# Patient Record
Sex: Female | Born: 1995 | Race: Black or African American | Hispanic: No | Marital: Single | Smoking: Never smoker
Health system: Southern US, Community
[De-identification: ages and names within clinical notes are randomized; demographics above are authoritative.]

## PROBLEM LIST (undated history)

## (undated) DIAGNOSIS — Z789 Other specified health status: Secondary | ICD-10-CM

## (undated) HISTORY — DX: Other specified health status: Z78.9

## (undated) HISTORY — PX: TONSILLECTOMY: SUR1361

---

## 2017-06-14 LAB — OB RESULTS CONSOLE VARICELLA ZOSTER ANTIBODY, IGG: VARICELLA IGG: IMMUNE

## 2017-06-14 LAB — OB RESULTS CONSOLE ANTIBODY SCREEN: Antibody Screen: NEGATIVE

## 2017-06-14 LAB — OB RESULTS CONSOLE HGB/HCT, BLOOD
HEMATOCRIT: 32
Hemoglobin: 10.7

## 2017-06-14 LAB — OB RESULTS CONSOLE ABO/RH: RH Type: NEGATIVE

## 2017-06-14 LAB — OB RESULTS CONSOLE HIV ANTIBODY (ROUTINE TESTING): HIV: NONREACTIVE

## 2017-06-14 LAB — OB RESULTS CONSOLE RUBELLA ANTIBODY, IGM: RUBELLA: IMMUNE

## 2017-06-14 LAB — OB RESULTS CONSOLE RPR: RPR: NONREACTIVE

## 2017-06-14 LAB — OB RESULTS CONSOLE HEPATITIS B SURFACE ANTIGEN: Hepatitis B Surface Ag: NEGATIVE

## 2017-06-14 LAB — OB RESULTS CONSOLE PLATELET COUNT: PLATELETS: 200

## 2017-06-16 LAB — CULTURE, OB URINE: URINE CULTURE, OB: NO GROWTH

## 2017-07-26 LAB — OB RESULTS CONSOLE GC/CHLAMYDIA
CHLAMYDIA, DNA PROBE: NEGATIVE
GC PROBE AMP, GENITAL: NEGATIVE

## 2017-07-31 NOTE — L&D Delivery Note (Signed)
Operative Delivery Note 22 y.o. G2P1001 at [redacted]w[redacted]d with maternal exhaustion after pushing for over 2 hours. Cervical examination showed fetal station of +2; ROP presentation.  Discussed options of continuing to push, operative vaginal delivery with vacuum or cesarean section. Risks and benefits discussed in detail. Patient desires vacuum assistance. FHR ovrrall reassuring; does have occasional variable decelerations  Risks of vacuum assistance were discussed in detail, including but not limited to, bleeding, infection, damage to maternal tissues, fetal cephalohematoma, inability to effect vaginal delivery of the head or shoulder dystocia that cannot be resolved by established maneuvers and need for emergency cesarean section.  Patient gave verbal consent.  Patient was examined and found to be fully dilated with fetal station of +2. The soft vacuum cup was positioned over the sagittal suture 3 cm anterior to posterior fontanelle.  Pressure was then increased to 500 mmHg, and the patient was instructed to push.  Pulling was administered along the pelvic curve.  One pull was administered during each push, no popoffs.  After about fix pushes, the infant was then delivered atraumatically,  At 3:38 PM a viable female was delivered via Vaginal, Vacuum [employer], operational).  Presentation: vertex; Position: Right,, Occiput,, Posterior.  Apgars of 7 and 9, weight was 7 pounds 15 ounces.  Arterial cord pH 7.14.  There was spontaneous placental delivery, intact with three-vessel cord.   Anesthesia:  Epidural Instruments: routine Episiotomy: none Lacerations: 2nd degree Suture Repair: 3.0 Monocryl and 0 Vicryl Est. Blood Loss (mL):  350 ml  Sponge, instrument and needle counts were correct x2.  The patient and baby were stable after delivery and remained in couplet care on the postpartum unit.   Jaynie Collins, MD, FACOG Obstetrician & Gynecologist, [facility] for [employer], [facility]

## 2017-09-20 LAB — OB RESULTS CONSOLE HGB/HCT, BLOOD
HEMATOCRIT: 34
Hemoglobin: 11.2

## 2017-09-20 LAB — OB RESULTS CONSOLE HIV ANTIBODY (ROUTINE TESTING): HIV: NONREACTIVE

## 2017-09-20 LAB — OB RESULTS CONSOLE RPR: RPR: NONREACTIVE

## 2017-09-20 LAB — OB RESULTS CONSOLE PLATELET COUNT: Platelets: 204

## 2017-10-16 ENCOUNTER — Encounter: Payer: Self-pay | Admitting: *Deleted

## 2017-10-22 ENCOUNTER — Encounter: Payer: Self-pay | Admitting: *Deleted

## 2017-10-23 ENCOUNTER — Encounter: Payer: Self-pay | Admitting: Medical

## 2017-10-23 ENCOUNTER — Ambulatory Visit (INDEPENDENT_AMBULATORY_CARE_PROVIDER_SITE_OTHER): Payer: Medicaid Other | Admitting: Medical

## 2017-10-23 ENCOUNTER — Encounter: Payer: Self-pay | Admitting: Family Medicine

## 2017-10-23 VITALS — BP 119/65 | HR 76 | Ht 68.0 in | Wt 165.9 lb

## 2017-10-23 DIAGNOSIS — O09893 Supervision of other high risk pregnancies, third trimester: Secondary | ICD-10-CM | POA: Diagnosis not present

## 2017-10-23 DIAGNOSIS — O0990 Supervision of high risk pregnancy, unspecified, unspecified trimester: Secondary | ICD-10-CM | POA: Insufficient documentation

## 2017-10-23 DIAGNOSIS — O26893 Other specified pregnancy related conditions, third trimester: Secondary | ICD-10-CM

## 2017-10-23 DIAGNOSIS — O43113 Circumvallate placenta, third trimester: Secondary | ICD-10-CM

## 2017-10-23 DIAGNOSIS — Z6791 Unspecified blood type, Rh negative: Secondary | ICD-10-CM | POA: Insufficient documentation

## 2017-10-23 DIAGNOSIS — Z3480 Encounter for supervision of other normal pregnancy, unspecified trimester: Secondary | ICD-10-CM

## 2017-10-23 DIAGNOSIS — O26899 Other specified pregnancy related conditions, unspecified trimester: Secondary | ICD-10-CM | POA: Insufficient documentation

## 2017-10-23 NOTE — Patient Instructions (Signed)
Fetal Movement Counts Patient Name: ________________________________________________ Patient Due Date: ____________________ What is a fetal movement count? A fetal movement count is the number of times that you feel your baby move during a certain amount of time. This may also be called a fetal kick count. A fetal movement count is recommended for every pregnant woman. You may be asked to start counting fetal movements as early as week 28 of your pregnancy. Pay attention to when your baby is most active. You may notice your baby's sleep and wake cycles. You may also notice things that make your baby move more. You should do a fetal movement count:  When your baby is normally most active.  At the same time each day.  A good time to count movements is while you are resting, after having something to eat and drink. How do I count fetal movements? 1. Find a quiet, comfortable area. Sit, or lie down on your side. 2. Write down the date, the start time and stop time, and the number of movements that you felt between those two times. Take this information with you to your health care visits. 3. For 2 hours, count kicks, flutters, swishes, rolls, and jabs. You should feel at least 10 movements during 2 hours. 4. You may stop counting after you have felt 10 movements. 5. If you do not feel 10 movements in 2 hours, have something to eat and drink. Then, keep resting and counting for 1 hour. If you feel at least 4 movements during that hour, you may stop counting. Contact a health care provider if:  You feel fewer than 4 movements in 2 hours.  Your baby is not moving like he or she usually does. Date: ____________ Start time: ____________ Stop time: ____________ Movements: ____________ Date: ____________ Start time: ____________ Stop time: ____________ Movements: ____________ Date: ____________ Start time: ____________ Stop time: ____________ Movements: ____________ Date: ____________ Start time:  ____________ Stop time: ____________ Movements: ____________ Date: ____________ Start time: ____________ Stop time: ____________ Movements: ____________ Date: ____________ Start time: ____________ Stop time: ____________ Movements: ____________ Date: ____________ Start time: ____________ Stop time: ____________ Movements: ____________ Date: ____________ Start time: ____________ Stop time: ____________ Movements: ____________ Date: ____________ Start time: ____________ Stop time: ____________ Movements: ____________ This information is not intended to replace advice given to you by your health care provider. Make sure you discuss any questions you have with your health care provider. Document Released: 08/16/2006 Document Revised: 03/15/2016 Document Reviewed: 08/26/2015 Elsevier Interactive Patient Education  2018 [employer].  [employer] of the uterus can occur throughout pregnancy, but they are not always a sign that you are in labor. You may have practice contractions called Braxton Hicks contractions. These false labor contractions are sometimes confused with true labor. What are [place] contractions? Braxton Hicks contractions are tightening movements that occur in the muscles of the uterus before labor. Unlike true labor contractions, these contractions do not result in opening (dilation) and thinning of the cervix. Toward the end of pregnancy (32-34 weeks), Braxton Hicks contractions can happen more often and may become stronger. These contractions are sometimes difficult to tell apart from true labor because they can be very uncomfortable. You should not feel embarrassed if you go to the hospital with false labor. Sometimes, the only way to tell if you are in true labor is for your health care provider to look for changes in the cervix. The health care provider will do a physical exam and may monitor your contractions.  If you are not in true labor, the exam  should show that your cervix is not dilating and your water has not broken. If there are other health problems associated with your pregnancy, it is completely safe for you to be sent home with false labor. You may continue to have Braxton Hicks contractions until you go into true labor. How to tell the difference between true labor and false labor True labor  Contractions last 30-70 seconds.  Contractions become very regular.  Discomfort is usually felt in the top of the uterus, and it spreads to the lower abdomen and low back.  Contractions do not go away with walking.  Contractions usually become more intense and increase in frequency.  The cervix dilates and gets thinner. False labor  Contractions are usually shorter and not as strong as true labor contractions.  Contractions are usually irregular.  Contractions are often felt in the front of the lower abdomen and in the groin.  Contractions may go away when you walk around or change positions while lying down.  Contractions get weaker and are shorter-lasting as time goes on.  The cervix usually does not dilate or become thin. Follow these instructions at home:  Take over-the-counter and prescription medicines only as told by your health care provider.  Keep up with your usual exercises and follow other instructions from your health care provider.  Eat and drink lightly if you think you are going into labor.  If Braxton Hicks contractions are making you uncomfortable: ? Change your position from lying down or resting to walking, or change from walking to resting. ? Sit and rest in a tub of warm water. ? Drink enough fluid to keep your urine pale yellow. Dehydration may cause these contractions. ? Do slow and deep breathing several times an hour.  Keep all follow-up prenatal visits as told by your health care provider. This is important. Contact a health care provider if:  You have a fever.  You have continuous pain  in your abdomen. Get help right away if:  Your contractions become stronger, more regular, and closer together.  You have fluid leaking or gushing from your vagina.  You pass blood-tinged mucus (bloody show).  You have bleeding from your vagina.  You have low back pain that you never had before.  You feel your baby's head pushing down and causing pelvic pressure.  Your baby is not moving inside you as much as it used to. Summary  Contractions that occur before labor are called Braxton Hicks contractions, false labor, or practice contractions.  Braxton Hicks contractions are usually shorter, weaker, farther apart, and less regular than true labor contractions. True labor contractions usually become progressively stronger and regular and they become more frequent.  Manage discomfort from [facility] contractions by changing position, resting in a warm bath, drinking plenty of water, or practicing deep breathing. This information is not intended to replace advice given to you by your health care provider. Make sure you discuss any questions you have with your health care provider. Document Released: 11/30/2016 Document Revised: 11/30/2016 Document Reviewed: 11/30/2016   Prenatal Care WHAT IS PRENATAL CARE? Prenatal care is the process of caring for a pregnant woman before she gives birth. Prenatal care makes sure that she and her baby remain as healthy as possible throughout pregnancy. Prenatal care may be provided by a midwife, family practice health care provider, or a childbirth and pregnancy specialist (obstetrician). Prenatal care may include physical examinations, testing, treatments, and  education on nutrition, lifestyle, and social support services. WHY IS PRENATAL CARE SO IMPORTANT? Early and consistent prenatal care increases the chance that you and your baby will remain healthy throughout your pregnancy. This type of care also decreases a baby's risk of being born too early  (prematurely), or being born smaller than expected (small for gestational age). Any underlying medical conditions you may have that could pose a risk during your pregnancy are discussed during prenatal care visits. You will also be monitored regularly for any new conditions that may arise during your pregnancy so they can be treated quickly and effectively. WHAT HAPPENS DURING PRENATAL CARE VISITS? Prenatal care visits may include the following: Discussion Tell your health care provider about any new signs or symptoms you have experienced since your last visit. These might include:  Nausea or vomiting.  Increased or decreased level of energy.  Difficulty sleeping.  Back or leg pain.  Weight changes.  Frequent urination.  Shortness of breath with physical activity.  Changes in your skin, such as the development of a rash or itchiness.  Vaginal discharge or bleeding.  Feelings of excitement or nervousness.  Changes in your baby's movements.  You may want to write down any questions or topics you want to discuss with your health care provider and bring them with you to your appointment. Examination During your first prenatal care visit, you will likely have a complete physical exam. Your health care provider will often examine your vagina, cervix, and the position of your uterus, as well as check your heart, lungs, and other body systems. As your pregnancy progresses, your health care provider will measure the size of your uterus and your baby's position inside your uterus. He or she may also examine you for early signs of labor. Your prenatal visits may also include checking your blood pressure and, after about 10-12 weeks of pregnancy, listening to your baby's heartbeat. Testing Regular testing often includes:  Urinalysis. This checks your urine for glucose, protein, or signs of infection.  Blood count. This checks the levels of white and red blood cells in your body.  Tests for  sexually transmitted infections (STIs). Testing for STIs at the beginning of pregnancy is routinely done and is required in many states.  Antibody testing. You will be checked to see if you are immune to certain illnesses, such as rubella, that can affect a developing fetus.  Glucose screen. Around 24-28 weeks of pregnancy, your blood glucose level will be checked for signs of gestational diabetes. Follow-up tests may be recommended.  Group B strep. This is a bacteria that is commonly found inside a woman's vagina. This test will inform your health care provider if you need an antibiotic to reduce the amount of this bacteria in your body prior to labor and childbirth.  Ultrasound. Many pregnant women undergo an ultrasound screening around 18-20 weeks of pregnancy to evaluate the health of the fetus and check for any developmental abnormalities.  HIV (human immunodeficiency virus) testing. Early in your pregnancy, you will be screened for HIV. If you are at high risk for HIV, this test may be repeated during your third trimester of pregnancy.  You may be offered other testing based on your age, personal or family medical history, or other factors. HOW OFTEN SHOULD I PLAN TO SEE MY HEALTH CARE PROVIDER FOR PRENATAL CARE? Your prenatal care check-up schedule depends on any medical conditions you have before, or develop during, your pregnancy. If you do not have any  underlying medical conditions, you will likely be seen for checkups:  Monthly, during the first 6 months of pregnancy.  Twice a month during months 7 and 8 of pregnancy.  Weekly starting in the 9th month of pregnancy and until delivery.  If you develop signs of early labor or other concerning signs or symptoms, you may need to see your health care provider more often. Ask your health care provider what prenatal care schedule is best for you. WHAT CAN I DO TO KEEP MYSELF AND MY BABY AS HEALTHY AS POSSIBLE DURING MY PREGNANCY?  Take a  prenatal vitamin containing 400 micrograms (0.4 mg) of folic acid every day. Your health care provider may also ask you to take additional vitamins such as iodine, vitamin D, iron, copper, and zinc.  Take 1500-2000 mg of calcium daily starting at your 20th week of pregnancy until you deliver your baby.  Make sure you are up to date on your vaccinations. Unless directed otherwise by your health care provider: ? You should receive a tetanus, diphtheria, and pertussis (Tdap) vaccination between the 27th and 36th week of your pregnancy, regardless of when your last Tdap immunization occurred. This helps protect your baby from whooping cough (pertussis) after he or she is born. ? You should receive an annual inactivated influenza vaccine (IIV) to help protect you and your baby from influenza. This can be done at any point during your pregnancy.  Eat a well-rounded diet that includes: ? Fresh fruits and vegetables. ? Lean proteins. ? Calcium-rich foods such as milk, yogurt, hard cheeses, and dark, leafy greens. ? Whole grain breads.  Do noteat seafood high in mercury, including: ? Swordfish. ? Tilefish. ? Shark. ? King mackerel. ? More than 6 oz tuna per week.  Do not eat: ? Raw or undercooked meats or eggs. ? Unpasteurized foods, such as soft cheeses (brie, blue, or feta), juices, and milks. ? Lunch meats. ? Hot dogs that have not been heated until they are steaming.  Drink enough water to keep your urine clear or pale yellow. For many women, this may be 10 or more 8 oz glasses of water each day. Keeping yourself hydrated helps deliver nutrients to your baby and may prevent the start of pre-term uterine contractions.  Do not use any tobacco products including cigarettes, chewing tobacco, or electronic cigarettes. If you need help quitting, ask your health care provider.  Do not drink beverages containing alcohol. No safe level of alcohol consumption during pregnancy has been  determined.  Do not use any illegal drugs. These can harm your developing baby or cause a miscarriage.  Ask your health care provider or pharmacist before taking any prescription or over-the-counter medicines, herbs, or supplements.  Limit your caffeine intake to no more than 200 mg per day.  Exercise. Unless told otherwise by your health care provider, try to get 30 minutes of moderate exercise most days of the week. Do not  do high-impact activities, contact sports, or activities with a high risk of falling, such as horseback riding or downhill skiing.  Get plenty of rest.  Avoid anything that raises your body temperature, such as hot tubs and saunas.  If you own a cat, do not empty its litter box. Bacteria contained in cat feces can cause an infection called toxoplasmosis. This can result in serious harm to the fetus.  Stay away from chemicals such as insecticides, lead, mercury, and cleaning or paint products that contain solvents.  Do not have any X-rays taken  unless medically necessary.  Take a childbirth and breastfeeding preparation class. Ask your health care provider if you need a referral or recommendation.  This information is not intended to replace advice given to you by your health care provider. Make sure you discuss any questions you have with your health care provider. Document Released: 07/20/2003 Document Revised: 12/20/2015 Document Reviewed: 10/01/2013 Elsevier Interactive Patient Education  2017 [place] Education Options: [facility] Department Classes:  Childbirth education classes can help you get ready for a positive parenting experience. You can also meet other expectant parents and get free stuff for your baby. Each class runs for five weeks on the same night and costs $45 for the mother-to-be and her support person. Medicaid covers the cost if you are eligible. Call [phone] to register. [facility] Childbirth  Education:  [phone] or [phone] or [email or web address]  Baby & Me Class: Discuss newborn & infant parenting and family adjustment issues with other new mothers in a relaxed environment. Each week brings a new speaker or baby-centered activity. We encourage new mothers to join [place] every Thursday at 11:00am. Babies birth until crawling. No registration or fee. Daddy [employer]: This course offers Dads-to-be the tools and knowledge needed to feel confident on their journey to becoming new fathers. Experienced dads, who have been trained as coaches, teach dads-to-be how to hold, comfort, diaper, swaddle and play with their infant while being able to support the new mom as well. A class for men taught by men. $25/dad Big Brother/Big Sister: Let your children share in the joy of a new brother or sister in this special class designed just for them. Class includes discussion about how families care for babies: swaddling, holding, diapering, safety as well as how they can be helpful in their new role. This class is designed for children ages 85 to 35, but any age is welcome. Please register each child individually. $5/child  Mom Talk: This mom-led group offers support and connection to mothers as they journey through the adjustments and struggles of that sometimes overwhelming first year after the birth of a child. Tuesdays at 10:00am and Thursdays at 6:00pm. Babies welcome. No registration or fee. Breastfeeding Support Group: This group is a mother-to-mother support circle where moms have the opportunity to share their breastfeeding experiences. A Lactation Consultant is present for questions and concerns. Meets each Tuesday at 11:00am. No fee or registration. Breastfeeding Your Baby: Learn what to expect in the first days of breastfeeding your newborn.  This class will help you feel more confident with the skills needed to begin your breastfeeding experience. Many new mothers are concerned about  breastfeeding after leaving the hospital. This class will also address the most common fears and challenges about breastfeeding during the first few weeks, months and beyond. (call for fee) Comfort Techniques and Tour: This 2 hour interactive class will provide you the opportunity to learn & practice hands-on techniques that can help relieve some of the discomfort of labor and encourage your baby to rotate toward the best position for birth. You and your partner will be able to try a variety of labor positions with birth balls and rebozos as well as practice breathing, relaxation, and visualization techniques. A tour of the [facility] is included with this class. $20 per registrant and support person Childbirth Class- Weekend Option: This class is a Weekend version of our Birth & Baby series. It is designed for parents who have a difficult time  fitting several weeks of classes into their schedule. It covers the care of your newborn and the basics of labor and childbirth. It also includes a [place] [facility] and lunch. The class is held two consecutive days: beginning on Friday evening from 6:30 - 8:30 p.m. and the next day, Saturday from 9 a.m. - 4 p.m. (call for fee) Doren Custard Class: Interested in a waterbirth?  This informational class will help you discover whether waterbirth is the right fit for you. Education about waterbirth itself, supplies you would need and how to assemble your support team is what you can expect from this class. Some obstetrical practices require this class in order to pursue a waterbirth. (Not all obstetrical practices offer waterbirth-check with your healthcare provider.) Register only the expectant mom, but you are encouraged to bring your partner to class! Required if planning waterbirth, no fee. Infant/Child CPR: Parents, grandparents, babysitters, and friends learn Cardio-Pulmonary Resuscitation skills for infants and  children. You will also learn how to treat both conscious and unconscious choking in infants and children. This Family & Friends program does not offer certification. Register each participant individually to ensure that enough mannequins are available. (Call for fee) Grandparent Love: Expecting a grandbaby? This class is for you! Learn about the latest infant care and safety recommendations and ways to support your own child as he or she transitions into the parenting role. Taught by Registered Nurses who are childbirth instructors, but most importantly...they are grandmothers too! $10/person. Childbirth Class- Natural Childbirth: This series of 5 weekly classes is for expectant parents who want to learn and practice natural methods of coping with the process of labor and childbirth. Relaxation, breathing, massage, visualization, role of the partner, and helpful positioning are highlighted. Participants learn how to be confident in their body's ability to give birth. This class will empower and help parents make informed decisions about their own care. Includes discussion that will help new parents transition into the immediate postpartum period. [facility] is included. We suggest taking this class between 25-32 weeks, but it's only a recommendation. $75 per registrant and one support person or $30 Medicaid. Childbirth Class- 3 week Series: This option of 3 weekly classes helps you and your labor partner prepare for childbirth. Newborn care, labor & birth, cesarean birth, pain management, and comfort techniques are discussed and a [place] [facility] is included. The class meets at the same time, on the same day of the week for 3 consecutive weeks beginning with the starting date you choose. $60 for registrant and one support person.  Marvelous Multiples: Expecting twins, triplets, or more? This class covers the differences in labor, birth,  parenting, and breastfeeding issues that face multiples' parents. NICU tour is included. Led by a Certified Childbirth Educator who is the mother of twins. No fee. Caring for Baby: This class is for expectant and adoptive parents who want to learn and practice the most up-to-date newborn care for their babies. Focus is on birth through the first six weeks of life. Topics include feeding, bathing, diapering, crying, umbilical cord care, circumcision care and safe sleep. Parents learn to recognize symptoms of illness and when to call the pediatrician. Register only the mom-to-be and your partner or support person can plan to come with you! $10 per registrant and support person Childbirth Class- online option: This online class offers you the freedom to complete a Birth and Baby series in the comfort  of your own home. The flexibility of this option allows you to review sections at your own pace, at times convenient to you and your support people. It includes additional video information, animations, quizzes, and extended activities. Get organized with helpful eClass tools, checklists, and trackers. Once you register online for the class, you will receive an email within a few days to accept the invitation and begin the class when the time is right for you. The content will be available to you for 60 days. $60 for 60 days of online access for you and your support people.  Local Doulas: Natural Baby Doulas [email or web address] Tel: [phone] [email or web address] [phone] [email or web address] The Labor [facility]ell Halim  (also do waterbirth tub rental) [phone] [email or web address] [phone] [email or web address] Navesink [email or web address] [employer]  (PADA) [email or web address] [place] [facility]  [email or web address] Considering Waterbirth? Guide for patients at [facility] [employer]  Why consider waterbirth?  . Gentle birth for babies . Less pain medicine used in labor . May allow for passive descent/less pushing . May reduce perineal tears  . More mobility and instinctive maternal position changes . Increased maternal relaxation . Reduced blood pressure in labor  Is waterbirth safe? What are the risks of infection, drowning or other complications?  . Infection: o Very low risk (3.7 % for tub vs 4.8% for bed) o 7 in 8000 waterbirths with documented infection o Poorly cleaned equipment most common cause o Slightly lower group B strep transmission rate  . Drowning o Maternal:  - Very low risk   - Related to seizures or fainting o Newborn:  - Very low risk. No evidence of increased risk of respiratory problems in multiple large studies - Physiological protection from breathing under water - Avoid underwater birth if there are any fetal complications - Once baby's head is out of the water, keep it out.  . Birth complication o Some reports of cord trauma, but risk decreased by bringing baby to surface gradually o No evidence of increased risk of shoulder dystocia. Mothers can usually change positions faster in water than in a bed, possibly aiding the maneuvers to free the shoulder.   You must attend a Doren Custard class at [facility]  3rd Wednesday of every month from 7-9pm  [employer] by calling [phone] or online at [email or web address]  Bring [place] the certificate from the class to your prenatal appointment  Meet with a midwife at 36 weeks to see if you can still plan a waterbirth and to sign the consent.   Purchase or rent the following supplies:   Water Birth Pool (Birth Pool in a Box or [place] for instance)  (Tubs start  ~$125)  Single-use disposable tub liner designed for your brand of tub  New garden hose labeled "lead-free", "suitable for drinking water",  Electric drain pump to remove water (We recommend 792 gallon per hour or greater pump.)   Separate garden hose to remove the dirty water  Fish net  Bathing suit top (optional)  Long-handled mirror (optional)  Places to purchase or rent supplies  GotWebTools.is for tub purchases and supplies  Waterbirthsolutions.com for tub purchases and supplies  The Labor Ladies (www.thelaborladies.com) $275 for tub rental/set-up & take down/kit   [employer] (http://www.fleming.com/.htm) Information regarding doulas (labor support) who provide pool rentals  Our practice has a Birth Pool in a Box tub at the hospital that you may borrow on a first-come-first-served basis. It is  your responsibility to to set up, clean and break down the tub. We cannot guarantee the availability of this tub in advance. You are responsible for bringing all accessories listed above. If you do not have all necessary supplies you cannot have a waterbirth.    Things that would prevent you from having a waterbirth:  Premature, <37wks  Previous cesarean birth  Presence of thick meconium-stained fluid  Multiple gestation (Twins, triplets, etc.)  Uncontrolled diabetes or gestational diabetes requiring medication  Hypertension requiring medication or diagnosis of pre-eclampsia  Heavy vaginal bleeding  Non-reassuring fetal heart rate  Active infection (MRSA, etc.). Group B Strep is NOT a contraindication for  waterbirth.  If your labor has to be induced and induction method requires continuous  monitoring of the baby's heart rate  Other risks/issues identified by your obstetrical provider  Please remember that birth is unpredictable. Under certain unforeseeable circumstances your provider may advise against giving birth in the tub. These  decisions will be made on a case-by-case basis and with the safety of you and your baby as our highest priority.     [facility] [place] [address] [place], [place]  [address] Phone - [phone]   Fax - [phone]  [facility] [address] [place], [place] [address] Phone - [phone]   Fax - [place] [address] [place], [place]  [address] Phone - [phone]   Fax - [phone]  [place] [address] [place] [address] [place], [place]  [address] Phone - [phone]   Fax - [phone]  [place] [address] [place], [place]  [address] Phone - [phone]   Fax - [phone]  [facility] [address] [place], [place]  [address] Phone - [phone]   Fax - [place] [address], [place], [place]  [address] Phone - [phone]   Fax - [phone]  [place] [address] [place], [place] [address] [place], [place]  [address] Phone - [phone]   Fax - [place] [address] [place], [place]  [address] Phone - [phone]   Fax - [phone] [facility] [place]. [address] [place], [place]  [address] Phone - [phone]   Fax - [phone]  [place] [address] [place], [place]  [address] Phone - [phone]   Fax - [phone]  [facility] [place], [place], [place]  [address] Phone - [phone]   Fax - [place] [address], [place], [place]  [address] Phone - [phone]   Fax - [phone]  [facility] [address] [place], [place]  [address] Phone - [place], [place]  [address] Phone - [phone]   Fax - [place] [address] [place], [place]  [address] Phone - [phone]   Fax - [phone]  [place] [address] [place], [place]  [address] Phone - [phone]   Fax - [place] [place], [place]  [address] Phone - [phone]   Fax - [place], [place],   [address] Phone -  [phone]   Fax - [place] [address], [place], [place]  [address] Phone - [phone]   Fax - [place] [address], [place], [place]  [address] Phone - [phone]   Fax - [place] [address], [place], [place]  [address] Phone - [phone]   Fax - [phone]  [place] [address] [place], [place]  [address] Phone - [phone]   Fax - [phone] [place] [address] [place], [place]  [address] Phone - [phone]   Fax - [place] [address] [place], [place]  [address] Phone - [phone]   Fax - [place] [address] [place],   [address] Phone - [phone]   Fax - [phone]  [place] MD [address] [place] [address] Phone [phone]  Fax [phone]  Safe Medications in Pregnancy   Acne:  Benzoyl Peroxide  Salicylic Acid   Backache/Headache:  Tylenol: 2 regular strength every 4 hours OR        2 Extra strength every 6 hours   Colds/Coughs/Allergies:  Benadryl (alcohol free) 25 mg every 6 hours as needed  Breath right strips  Claritin  Cepacol throat lozenges  Chloraseptic throat spray  Cold-Eeze-  up to three times per day  Cough drops, alcohol free  Flonase (by prescription only)  Guaifenesin  Mucinex  Robitussin DM (plain only, alcohol free)  Saline nasal spray/drops  Sudafed (pseudoephedrine) & Actifed * use only after [redacted] weeks gestation and if you do not have high blood pressure  Tylenol  Vicks Vaporub  Zinc lozenges  Zyrtec   Constipation:  Colace  Ducolax suppositories  Fleet enema  Glycerin suppositories  Metamucil  Milk of magnesia  Miralax  Senokot  Smooth move tea   Diarrhea:  Kaopectate  Imodium A-D   *NO pepto Bismol   Hemorrhoids:  Anusol  Anusol HC  Preparation H  Tucks   Indigestion:  Tums  Maalox  Mylanta  Zantac  Pepcid   Insomnia:  Benadryl (alcohol free) '25mg'$  every 6 hours as needed  Tylenol PM  Unisom, no Gelcaps   Leg Cramps:  Tums  MagGel   Nausea/Vomiting:  Bonine  Dramamine  Emetrol  Ginger extract  Sea bands  Meclizine  Nausea medication to take during pregnancy:  Unisom (doxylamine succinate 25 mg tablets) Take one tablet daily at bedtime. If symptoms are not adequately controlled, the dose can be increased to a maximum recommended dose of two tablets daily (1/2 tablet in the morning, 1/2 tablet mid-afternoon and one at bedtime).  Vitamin B6 '100mg'$  tablets. Take one tablet twice a day (up to 200 mg per day).   Skin Rashes:  Aveeno products  Benadryl cream or '25mg'$  every 6 hours as needed  Calamine Lotion  1% cortisone cream   Yeast infection:  Gyne-lotrimin 7  Monistat 7    **If taking multiple medications, please check labels to avoid duplicating the same active ingredients  **take medication as directed on the label  ** Do not exceed 4000 mg of tylenol in 24 hours  **Do not take medications that contain aspirin or ibuprofen           Chartered certified accountant Patient Education  [employer].

## 2017-10-23 NOTE — Progress Notes (Signed)
   PRENATAL VISIT NOTE  Subjective:  Jill Arias is a 22 y.o. G3P1001 at 3736w1d being seen today for her first prenatal care visit with us since moving from [place]. She had all normal routine prenatal care prior to moving. She is currently monitored for the following issues for this low-risk pregnancy and has Circumvallate placenta during pregnancy in third trimester, antepartum; Rh negative state in antepartum period, third trimester; and Supervision of high risk pregnancy, antepartum on their problem list.  Patient reports no complaints.  Contractions: Not present. Vag. Bleeding: None.  Movement: Present. Denies leaking of fluid.   The following portions of the patient's history were reviewed and updated as appropriate: allergies, current medications, past family history, past medical history, past social history, past surgical history and problem list. Problem list updated.  Objective:   Vitals:   10/23/17 1051 10/23/17 1057  BP: 119/65   Pulse: 76   Weight: 165 lb 14.4 oz (75.3 kg)   Height:  5\' 8"  (1.727 m)    Fetal Status: Fetal Heart Rate (bpm): 140   Movement: Present     General:  Alert, oriented and cooperative. Patient is in no acute distress.  Skin: Skin is warm and dry. No rash noted.   Cardiovascular: Normal heart rate noted  Respiratory: Normal respiratory effort, no problems with respiration noted  Abdomen: Soft, gravid, appropriate for gestational age.  Pain/Pressure: Present     Pelvic: Cervical exam deferred        Extremities: Normal range of motion.  Edema: None  Mental Status:  Normal mood and affect. Normal behavior. Normal judgment and thought content.   Assessment and Plan:  Pregnancy: G3P1001 at 5036w1d  1. Rh negative state in antepartum period, third trimester - Received Rhogam 09/20/17 in [place] - Will need PP  2. Circumvallate placenta during pregnancy in third trimester, antepartum - US MFM OB DETAIL +14 WK; for growth in third trimester   3.  Supervision of high risk pregnancy, antepartum - Discussed the nature of our practice  - Encouraged hospital tour  Preterm labor symptoms and general obstetric precautions including but not limited to vaginal bleeding, contractions, leaking of fluid and fetal movement were reviewed in detail with the patient. Please refer to After Visit Summary for other counseling recommendations.  Return in about 2 weeks (around 11/06/2017) for LOB.   Vonzella NippleJulie Daymond Cordts, PA-C

## 2017-11-05 ENCOUNTER — Telehealth: Payer: Self-pay | Admitting: Licensed Clinical Social Worker

## 2017-11-05 ENCOUNTER — Ambulatory Visit (HOSPITAL_COMMUNITY): Payer: Self-pay

## 2017-11-05 NOTE — Telephone Encounter (Signed)
CSW A.Torris House contact pt to confirmed scheduled appt. Pt confirmed

## 2017-11-06 ENCOUNTER — Encounter: Payer: Self-pay | Admitting: Certified Nurse Midwife

## 2017-11-07 ENCOUNTER — Encounter (HOSPITAL_COMMUNITY): Payer: Self-pay

## 2017-11-07 ENCOUNTER — Ambulatory Visit (INDEPENDENT_AMBULATORY_CARE_PROVIDER_SITE_OTHER): Payer: Medicaid Other

## 2017-11-07 ENCOUNTER — Ambulatory Visit (HOSPITAL_COMMUNITY)
Admission: RE | Admit: 2017-11-07 | Discharge: 2017-11-07 | Disposition: A | Discharge status: Home / Self Care | Payer: Medicaid Other | Source: Ambulatory Visit | Attending: Medical | Admitting: Medical

## 2017-11-07 ENCOUNTER — Other Ambulatory Visit: Payer: Self-pay | Admitting: Medical

## 2017-11-07 VITALS — BP 128/67 | HR 75 | Wt 167.1 lb

## 2017-11-07 DIAGNOSIS — O43113 Circumvallate placenta, third trimester: Secondary | ICD-10-CM

## 2017-11-07 DIAGNOSIS — O0990 Supervision of high risk pregnancy, unspecified, unspecified trimester: Secondary | ICD-10-CM

## 2017-11-07 DIAGNOSIS — Z3A35 35 weeks gestation of pregnancy: Secondary | ICD-10-CM | POA: Insufficient documentation

## 2017-11-07 DIAGNOSIS — Z3480 Encounter for supervision of other normal pregnancy, unspecified trimester: Secondary | ICD-10-CM

## 2017-11-07 DIAGNOSIS — Z363 Encounter for antenatal screening for malformations: Secondary | ICD-10-CM | POA: Diagnosis not present

## 2017-11-07 NOTE — Patient Instructions (Signed)

## 2017-11-07 NOTE — Progress Notes (Signed)
   PRENATAL VISIT NOTE  Subjective:  Jill Arias is a 22 y.o. G2P1001 at 9013w2d being seen today for ongoing prenatal care.  She is currently monitored for the following issues for this high-risk pregnancy and has Circumvallate placenta during pregnancy in third trimester, antepartum; Rh negative state in antepartum period, third trimester; and Supervision of high risk pregnancy, antepartum on their problem list.  Patient reports no complaints.  Contractions: Not present. Vag. Bleeding: None.  Movement: Present. Denies leaking of fluid.   The following portions of the patient's history were reviewed and updated as appropriate: allergies, current medications, past family history, past medical history, past social history, past surgical history and problem list. Problem list updated.  Objective:   Vitals:   11/07/17 1346  BP: 128/67  Pulse: 75  Weight: 167 lb 1.6 oz (75.8 kg)    Fetal Status: Fetal Heart Rate (bpm): 136   Movement: Present     General:  Alert, oriented and cooperative. Patient is in no acute distress.  Skin: Skin is warm and dry. No rash noted.   Cardiovascular: Normal heart rate noted  Respiratory: Normal respiratory effort, no problems with respiration noted  Abdomen: Soft, gravid, appropriate for gestational age.  Pain/Pressure: Present     Pelvic: Cervical exam deferred        Extremities: Normal range of motion.  Edema: Trace  Mental Status: Normal mood and affect. Normal behavior. Normal judgment and thought content.   Assessment and Plan:  Pregnancy: G2P1001 at 3513w2d  1. Supervision of high risk pregnancy, antepartum - No complaints. Routine care - F/u ultrasound today normal. No further ultrasounds indicated. - Patient desires to do GBS next visit.   Preterm labor symptoms and general obstetric precautions including but not limited to vaginal bleeding, contractions, leaking of fluid and fetal movement were reviewed in detail with the patient. Please  refer to After Visit Summary for other counseling recommendations.  Return in about 2 weeks (around 11/21/2017) for Return OB visit.  Future Appointments  Date Time Provider Department Center  11/07/2017  1:55 PM Rolm BookbinderNeill, Caroline M, CNM [facility]    Rolm Bookbinderaroline M Neill, [place] 11/07/17 1:59 PM

## 2017-11-15 ENCOUNTER — Encounter: Payer: Self-pay | Admitting: Obstetrics and Gynecology

## 2017-11-15 ENCOUNTER — Other Ambulatory Visit (HOSPITAL_COMMUNITY)
Admission: RE | Admit: 2017-11-15 | Discharge: 2017-11-15 | Disposition: A | Discharge status: Home / Self Care | Payer: Medicaid Other | Source: Ambulatory Visit | Attending: Obstetrics and Gynecology | Admitting: Obstetrics and Gynecology

## 2017-11-15 ENCOUNTER — Ambulatory Visit (INDEPENDENT_AMBULATORY_CARE_PROVIDER_SITE_OTHER): Payer: Medicaid Other | Admitting: Obstetrics and Gynecology

## 2017-11-15 VITALS — BP 118/58 | HR 85 | Wt 169.4 lb

## 2017-11-15 DIAGNOSIS — O0990 Supervision of high risk pregnancy, unspecified, unspecified trimester: Secondary | ICD-10-CM | POA: Diagnosis not present

## 2017-11-15 DIAGNOSIS — O26893 Other specified pregnancy related conditions, third trimester: Secondary | ICD-10-CM

## 2017-11-15 DIAGNOSIS — Z3A00 Weeks of gestation of pregnancy not specified: Secondary | ICD-10-CM | POA: Diagnosis not present

## 2017-11-15 DIAGNOSIS — Z6791 Unspecified blood type, Rh negative: Secondary | ICD-10-CM | POA: Diagnosis not present

## 2017-11-15 LAB — OB RESULTS CONSOLE GC/CHLAMYDIA: Gonorrhea: NEGATIVE

## 2017-11-15 LAB — OB RESULTS CONSOLE GBS: GBS: NEGATIVE

## 2017-11-15 NOTE — Progress Notes (Signed)
   PRENATAL VISIT NOTE  Subjective:  Jill Arias is a 22 y.o. G2P1001 at 651w3d being seen today for ongoing prenatal care.  She is currently monitored for the following issues for this low-risk pregnancy and has Circumvallate placenta during pregnancy in third trimester, antepartum; Rh negative state in antepartum period, third trimester; and Supervision of high risk pregnancy, antepartum on their problem list.  Patient reports no complaints.  Contractions: Not present. Vag. Bleeding: None.  Movement: Present. Denies leaking of fluid.   The following portions of the patient's history were reviewed and updated as appropriate: allergies, current medications, past family history, past medical history, past social history, past surgical history and problem list. Problem list updated.  Objective:   Vitals:   11/15/17 1417  BP: (!) 118/58  Pulse: 85  Weight: 169 lb 6.4 oz (76.8 kg)    Fetal Status: Fetal Heart Rate (bpm): 131   Movement: Present  Presentation: Vertex  General:  Alert, oriented and cooperative. Patient is in no acute distress.  Skin: Skin is warm and dry. No rash noted.   Cardiovascular: Normal heart rate noted  Respiratory: Normal respiratory effort, no problems with respiration noted  Abdomen: Soft, gravid, appropriate for gestational age.  Pain/Pressure: Present     Pelvic: Cervical exam performed Dilation: Fingertip Effacement (%): Thick Station: -3  Extremities: Normal range of motion.  Edema: None  Mental Status: Normal mood and affect. Normal behavior. Normal judgment and thought content.   Assessment and Plan:  Pregnancy: G2P1001 at 8551w3d  1. Supervision of high risk pregnancy, antepartum Patient is doing well without complaints Cultures today Reviewed 4/10 ultrasound - Culture, beta strep (group b only) - GC/Chlamydia probe amp ([place]not at [facility]  2. Rh negative state in antepartum period, third trimester S/p rhogam  Preterm labor symptoms and  general obstetric precautions including but not limited to vaginal bleeding, contractions, leaking of fluid and fetal movement were reviewed in detail with the patient. Please refer to After Visit Summary for other counseling recommendations.  Return in about 1 year (around 11/16/2018) for ROB.  No future appointments.  Catalina AntiguaPeggy Jenayah Antu, MD

## 2017-11-16 LAB — GC/CHLAMYDIA PROBE AMP (~~LOC~~) NOT AT ARMC
Chlamydia: NEGATIVE
Neisseria Gonorrhea: NEGATIVE

## 2017-11-19 LAB — CULTURE, BETA STREP (GROUP B ONLY): Strep Gp B Culture: NEGATIVE

## 2017-11-27 ENCOUNTER — Encounter: Payer: Self-pay | Admitting: Advanced Practice Midwife

## 2017-11-27 ENCOUNTER — Ambulatory Visit (INDEPENDENT_AMBULATORY_CARE_PROVIDER_SITE_OTHER): Payer: Medicaid Other | Admitting: Advanced Practice Midwife

## 2017-11-27 ENCOUNTER — Encounter: Payer: Self-pay | Admitting: Licensed Clinical Social Worker

## 2017-11-27 VITALS — BP 124/69 | HR 81 | Wt 175.1 lb

## 2017-11-27 DIAGNOSIS — O0990 Supervision of high risk pregnancy, unspecified, unspecified trimester: Secondary | ICD-10-CM

## 2017-11-27 DIAGNOSIS — O0993 Supervision of high risk pregnancy, unspecified, third trimester: Secondary | ICD-10-CM

## 2017-11-27 NOTE — Progress Notes (Signed)
CSW A. Linton Rump met privately with MOB to discuss parenting, social and contraception options. MOB reports she resides in [place] with her mother. MOB reports FOB is not involved and this is her second pregnancy. CSW and MOB discuss contraception options. CSW provided MOB with GED course information an reading materials regarding IUD.

## 2017-11-27 NOTE — Progress Notes (Signed)
   PRENATAL VISIT NOTE  Subjective:  Jill Arias is a 22 y.o. G2P1001 at [redacted]w[redacted]d being seen today for ongoing prenatal care.  She is currently monitored for the following issues for this high-risk pregnancy and has Circumvallate placenta during pregnancy in third trimester, antepartum; Rh negative state in antepartum period, third trimester; and Supervision of high risk pregnancy, antepartum on their problem list.  Patient reports no complaints.  Contractions: Irregular. Vag. Bleeding: None.  Movement: Present. Denies leaking of fluid.   The following portions of the patient's history were reviewed and updated as appropriate: allergies, current medications, past family history, past medical history, past social history, past surgical history and problem list. Problem list updated.  Objective:   Vitals:   11/27/17 1601  BP: 124/69  Pulse: 81  Weight: 175 lb 1.6 oz (79.4 kg)    Fetal Status: Fetal Heart Rate (bpm): 145 Fundal Height: 38 cm Movement: Present     General:  Alert, oriented and cooperative. Patient is in no acute distress.  Skin: Skin is warm and dry. No rash noted.   Cardiovascular: Normal heart rate noted  Respiratory: Normal respiratory effort, no problems with respiration noted  Abdomen: Soft, gravid, appropriate for gestational age.  Pain/Pressure: Present     Pelvic: Cervical exam deferred        Extremities: Normal range of motion.  Edema: Trace  Mental Status: Normal mood and affect. Normal behavior. Normal judgment and thought content.   Assessment and Plan:  Pregnancy: G2P1001 at [redacted]w[redacted]d  1. Supervision of high risk pregnancy, antepartum - Routine care - Birth control discussed at length today. Patient plans IUD   Preterm labor symptoms and general obstetric precautions including but not limited to vaginal bleeding, contractions, leaking of fluid and fetal movement were reviewed in detail with the patient. Please refer to After Visit Summary for other  counseling recommendations.  Return in about 1 week (around 12/04/2017).  No future appointments.  Thressa Sheller, CNM

## 2017-12-04 ENCOUNTER — Ambulatory Visit (INDEPENDENT_AMBULATORY_CARE_PROVIDER_SITE_OTHER): Payer: Medicaid Other | Admitting: Obstetrics and Gynecology

## 2017-12-04 VITALS — BP 124/74 | HR 90 | Wt 175.8 lb

## 2017-12-04 DIAGNOSIS — O0990 Supervision of high risk pregnancy, unspecified, unspecified trimester: Secondary | ICD-10-CM

## 2017-12-04 DIAGNOSIS — O0993 Supervision of high risk pregnancy, unspecified, third trimester: Secondary | ICD-10-CM | POA: Diagnosis present

## 2017-12-04 NOTE — Progress Notes (Signed)
Subjective:  Jill Arias is a 22 y.o. G2P1001 at [redacted]w[redacted]d being seen today for ongoing prenatal care.  She is currently monitored for the following issues for this low-risk pregnancy and has Circumvallate placenta during pregnancy in third trimester, antepartum; Rh negative state in antepartum period, third trimester; and Supervision of high risk pregnancy, antepartum on their problem list.  Patient reports no complaints.  Contractions: Irritability. Vag. Bleeding: None.  Movement: Present. Denies leaking of fluid.   The following portions of the patient's history were reviewed and updated as appropriate: allergies, current medications, past family history, past medical history, past social history, past surgical history and problem list. Problem list updated.  Objective:   Vitals:   12/04/17 1628  BP: 124/74  Pulse: 90  Weight: 175 lb 12.8 oz (79.7 kg)    Fetal Status: Fetal Heart Rate (bpm): 150 Fundal Height: 38 cm Movement: Present  Presentation: Vertex  General:  Alert, oriented and cooperative. Patient is in no acute distress.  Skin: Skin is warm and dry. No rash noted.   Cardiovascular: Normal heart rate noted  Respiratory: Normal respiratory effort, no problems with respiration noted  Abdomen: Soft, gravid, appropriate for gestational age. Pain/Pressure: Present     Pelvic: Vag. Bleeding: None     Cervical exam performed Dilation: 2 Effacement (%): 40 Station: -3  Extremities: Normal range of motion.  Edema: Trace  Mental Status: Normal mood and affect. Normal behavior. Normal judgment and thought content.   Urinalysis:      Assessment and Plan:  Pregnancy: G2P1001 at [redacted]w[redacted]d  1. Supervision of high risk pregnancy, antepartum Doing well. S/p Rhogam. Schedule induction at next visit. Swept membranes today per patient request.   Term labor symptoms and general obstetric precautions including but not limited to vaginal bleeding, contractions, leaking of fluid and fetal  movement were reviewed in detail with the patient. Please refer to After Visit Summary for other counseling recommendations.  Return in about 1 week (around 12/11/2017) for ob visit.   Pincus Large, DO

## 2017-12-09 ENCOUNTER — Other Ambulatory Visit: Payer: Self-pay

## 2017-12-09 ENCOUNTER — Inpatient Hospital Stay (HOSPITAL_COMMUNITY)
Admission: AD | Admit: 2017-12-09 | Discharge: 2017-12-11 | DRG: 807 | Disposition: A | Discharge status: Home / Self Care | Payer: Medicaid Other | Source: Ambulatory Visit | Attending: Obstetrics & Gynecology | Admitting: Obstetrics & Gynecology

## 2017-12-09 ENCOUNTER — Inpatient Hospital Stay (HOSPITAL_COMMUNITY): Payer: Medicaid Other | Admitting: Anesthesiology

## 2017-12-09 ENCOUNTER — Encounter (HOSPITAL_COMMUNITY): Payer: Self-pay | Admitting: *Deleted

## 2017-12-09 DIAGNOSIS — O26893 Other specified pregnancy related conditions, third trimester: Secondary | ICD-10-CM | POA: Diagnosis present

## 2017-12-09 DIAGNOSIS — O164 Unspecified maternal hypertension, complicating childbirth: Secondary | ICD-10-CM | POA: Diagnosis present

## 2017-12-09 DIAGNOSIS — O4292 Full-term premature rupture of membranes, unspecified as to length of time between rupture and onset of labor: Secondary | ICD-10-CM | POA: Diagnosis present

## 2017-12-09 DIAGNOSIS — Z8759 Personal history of other complications of pregnancy, childbirth and the puerperium: Secondary | ICD-10-CM

## 2017-12-09 DIAGNOSIS — O0990 Supervision of high risk pregnancy, unspecified, unspecified trimester: Secondary | ICD-10-CM

## 2017-12-09 DIAGNOSIS — Z3A39 39 weeks gestation of pregnancy: Secondary | ICD-10-CM | POA: Diagnosis not present

## 2017-12-09 DIAGNOSIS — O43113 Circumvallate placenta, third trimester: Secondary | ICD-10-CM | POA: Diagnosis present

## 2017-12-09 DIAGNOSIS — Z6791 Unspecified blood type, Rh negative: Secondary | ICD-10-CM

## 2017-12-09 LAB — COMPREHENSIVE METABOLIC PANEL
ALT: 13 U/L — AB (ref 14–54)
ANION GAP: 9 (ref 5–15)
AST: 21 U/L (ref 15–41)
Albumin: 3.3 g/dL — ABNORMAL LOW (ref 3.5–5.0)
Alkaline Phosphatase: 212 U/L — ABNORMAL HIGH (ref 38–126)
BUN: 11 mg/dL (ref 6–20)
CHLORIDE: 106 mmol/L (ref 101–111)
CO2: 21 mmol/L — AB (ref 22–32)
Calcium: 8.6 mg/dL — ABNORMAL LOW (ref 8.9–10.3)
Creatinine, Ser: 0.64 mg/dL (ref 0.44–1.00)
Glucose, Bld: 94 mg/dL (ref 65–99)
POTASSIUM: 4 mmol/L (ref 3.5–5.1)
SODIUM: 136 mmol/L (ref 135–145)
Total Bilirubin: 0.4 mg/dL (ref 0.3–1.2)
Total Protein: 6.4 g/dL — ABNORMAL LOW (ref 6.5–8.1)

## 2017-12-09 LAB — PROTEIN / CREATININE RATIO, URINE
Creatinine, Urine: 44 mg/dL
PROTEIN CREATININE RATIO: 0.16 mg/mg{creat} — AB (ref 0.00–0.15)
TOTAL PROTEIN, URINE: 7 mg/dL

## 2017-12-09 LAB — CBC
HEMATOCRIT: 33.7 % — AB (ref 36.0–46.0)
HEMOGLOBIN: 11.1 g/dL — AB (ref 12.0–15.0)
MCH: 29.7 pg (ref 26.0–34.0)
MCHC: 32.9 g/dL (ref 30.0–36.0)
MCV: 90.1 fL (ref 78.0–100.0)
Platelets: 193 10*3/uL (ref 150–400)
RBC: 3.74 MIL/uL — ABNORMAL LOW (ref 3.87–5.11)
RDW: 13.1 % (ref 11.5–15.5)
WBC: 13.5 10*3/uL — AB (ref 4.0–10.5)

## 2017-12-09 LAB — POCT FERN TEST: POCT Fern Test: POSITIVE

## 2017-12-09 MED ORDER — KETOROLAC TROMETHAMINE 30 MG/ML IJ SOLN
30.0000 mg | Freq: Four times a day (QID) | INTRAMUSCULAR | Status: DC
  Administered 2017-12-09 – 2017-12-10 (×3): 30 mg via INTRAVENOUS
  Filled 2017-12-09 (×3): qty 1

## 2017-12-09 MED ORDER — DIPHENHYDRAMINE HCL 25 MG PO CAPS: 25.0000 mg | ORAL_CAPSULE | Freq: Four times a day (QID) | ORAL | Status: DC | PRN

## 2017-12-09 MED ORDER — PHENYLEPHRINE 40 MCG/ML (10ML) SYRINGE FOR IV PUSH (FOR BLOOD PRESSURE SUPPORT)
80.0000 ug | PREFILLED_SYRINGE | INTRAVENOUS | Status: DC | PRN
  Filled 2017-12-09: qty 10

## 2017-12-09 MED ORDER — ONDANSETRON HCL 4 MG/2ML IJ SOLN: 4.0000 mg | INTRAMUSCULAR | Status: DC | PRN

## 2017-12-09 MED ORDER — OXYTOCIN 40 UNITS IN LACTATED RINGERS INFUSION - SIMPLE MED
2.5000 [IU]/h | INTRAVENOUS | Status: DC
  Filled 2017-12-09: qty 1000

## 2017-12-09 MED ORDER — EPHEDRINE 5 MG/ML INJ
10.0000 mg | INTRAVENOUS | Status: DC | PRN
  Administered 2017-12-09: 10 mg via INTRAVENOUS

## 2017-12-09 MED ORDER — LIDOCAINE HCL (PF) 1 % IJ SOLN
30.0000 mL | INTRAMUSCULAR | Status: DC | PRN
  Filled 2017-12-09: qty 30

## 2017-12-09 MED ORDER — PHENYLEPHRINE 40 MCG/ML (10ML) SYRINGE FOR IV PUSH (FOR BLOOD PRESSURE SUPPORT): 80.0000 ug | PREFILLED_SYRINGE | INTRAVENOUS | Status: DC | PRN

## 2017-12-09 MED ORDER — PRENATAL MULTIVITAMIN CH
1.0000 | ORAL_TABLET | Freq: Every day | ORAL | Status: DC
  Administered 2017-12-10: 1 via ORAL
  Filled 2017-12-09: qty 1

## 2017-12-09 MED ORDER — DIBUCAINE 1 % RE OINT: 1.0000 "application " | TOPICAL_OINTMENT | RECTAL | Status: DC | PRN

## 2017-12-09 MED ORDER — OXYCODONE HCL 5 MG PO TABS
5.0000 mg | ORAL_TABLET | ORAL | Status: DC | PRN
Start: 2017-12-09 — End: 2017-12-11

## 2017-12-09 MED ORDER — SOD CITRATE-CITRIC ACID 500-334 MG/5ML PO SOLN: 30.0000 mL | ORAL | Status: DC | PRN

## 2017-12-09 MED ORDER — BENZOCAINE-MENTHOL 20-0.5 % EX AERO
1.0000 "application " | INHALATION_SPRAY | CUTANEOUS | Status: DC | PRN
  Administered 2017-12-10: 1 via TOPICAL
  Filled 2017-12-09: qty 56

## 2017-12-09 MED ORDER — ACETAMINOPHEN 325 MG PO TABS
650.0000 mg | ORAL_TABLET | ORAL | Status: DC | PRN
  Filled 2017-12-09 (×4): qty 2

## 2017-12-09 MED ORDER — LIDOCAINE HCL (PF) 1 % IJ SOLN
INTRAMUSCULAR | Status: DC | PRN
  Administered 2017-12-09: 8 mL via EPIDURAL

## 2017-12-09 MED ORDER — WITCH HAZEL-GLYCERIN EX PADS: 1.0000 "application " | MEDICATED_PAD | CUTANEOUS | Status: DC | PRN

## 2017-12-09 MED ORDER — TERBUTALINE SULFATE 1 MG/ML IJ SOLN
INTRAMUSCULAR | Status: AC
  Administered 2017-12-09: 0.25 mg via SUBCUTANEOUS
  Filled 2017-12-09: qty 1

## 2017-12-09 MED ORDER — IBUPROFEN 600 MG PO TABS
600.0000 mg | ORAL_TABLET | Freq: Four times a day (QID) | ORAL | Status: DC
  Administered 2017-12-10 – 2017-12-11 (×4): 600 mg via ORAL
  Filled 2017-12-09 (×5): qty 1

## 2017-12-09 MED ORDER — ONDANSETRON HCL 4 MG/2ML IJ SOLN: 4.0000 mg | Freq: Four times a day (QID) | INTRAMUSCULAR | Status: DC | PRN

## 2017-12-09 MED ORDER — TETANUS-DIPHTH-ACELL PERTUSSIS 5-2.5-18.5 LF-MCG/0.5 IM SUSP: 0.5000 mL | Freq: Once | INTRAMUSCULAR | Status: DC

## 2017-12-09 MED ORDER — ONDANSETRON HCL 4 MG PO TABS: 4.0000 mg | ORAL_TABLET | ORAL | Status: DC | PRN

## 2017-12-09 MED ORDER — LACTATED RINGERS IV SOLN
INTRAVENOUS | Status: DC
  Administered 2017-12-09 (×3): via INTRAVENOUS

## 2017-12-09 MED ORDER — FENTANYL 2.5 MCG/ML BUPIVACAINE 1/10 % EPIDURAL INFUSION (WH - ANES)
14.0000 mL/h | INTRAMUSCULAR | Status: DC | PRN
  Administered 2017-12-09 (×2): 14 mL/h via EPIDURAL
  Filled 2017-12-09 (×2): qty 100

## 2017-12-09 MED ORDER — LACTATED RINGERS IV SOLN: 500.0000 mL | INTRAVENOUS | Status: DC | PRN

## 2017-12-09 MED ORDER — EPHEDRINE 5 MG/ML INJ
10.0000 mg | INTRAVENOUS | Status: DC | PRN
  Filled 2017-12-09: qty 4

## 2017-12-09 MED ORDER — OXYTOCIN 40 UNITS IN LACTATED RINGERS INFUSION - SIMPLE MED
1.0000 m[IU]/min | INTRAVENOUS | Status: DC
  Administered 2017-12-09: 2 m[IU]/min via INTRAVENOUS

## 2017-12-09 MED ORDER — OXYCODONE-ACETAMINOPHEN 5-325 MG PO TABS: 2.0000 | ORAL_TABLET | ORAL | Status: DC | PRN

## 2017-12-09 MED ORDER — OXYCODONE-ACETAMINOPHEN 5-325 MG PO TABS: 1.0000 | ORAL_TABLET | ORAL | Status: DC | PRN

## 2017-12-09 MED ORDER — ZOLPIDEM TARTRATE 5 MG PO TABS: 5.0000 mg | ORAL_TABLET | Freq: Every evening | ORAL | Status: DC | PRN

## 2017-12-09 MED ORDER — TERBUTALINE SULFATE 1 MG/ML IJ SOLN
0.2500 mg | Freq: Once | INTRAMUSCULAR | Status: AC
  Administered 2017-12-09: 0.25 mg via SUBCUTANEOUS

## 2017-12-09 MED ORDER — OXYCODONE HCL 5 MG PO TABS: 10.0000 mg | ORAL_TABLET | ORAL | Status: DC | PRN

## 2017-12-09 MED ORDER — ACETAMINOPHEN 325 MG PO TABS
650.0000 mg | ORAL_TABLET | ORAL | Status: DC | PRN
  Administered 2017-12-09 – 2017-12-10 (×3): 650 mg via ORAL

## 2017-12-09 MED ORDER — FLEET ENEMA 7-19 GM/118ML RE ENEM: 1.0000 | ENEMA | RECTAL | Status: DC | PRN

## 2017-12-09 MED ORDER — OXYTOCIN BOLUS FROM INFUSION
500.0000 mL | Freq: Once | INTRAVENOUS | Status: AC
  Administered 2017-12-09: 500 mL via INTRAVENOUS

## 2017-12-09 MED ORDER — FENTANYL CITRATE (PF) 100 MCG/2ML IJ SOLN
100.0000 ug | INTRAMUSCULAR | Status: DC | PRN
  Administered 2017-12-09 (×2): 100 ug via INTRAVENOUS
  Filled 2017-12-09 (×2): qty 2

## 2017-12-09 MED ORDER — COCONUT OIL OIL: 1.0000 "application " | TOPICAL_OIL | Status: DC | PRN

## 2017-12-09 MED ORDER — LACTATED RINGERS IV SOLN
500.0000 mL | Freq: Once | INTRAVENOUS | Status: AC
  Administered 2017-12-09: 500 mL via INTRAVENOUS

## 2017-12-09 MED ORDER — SENNOSIDES-DOCUSATE SODIUM 8.6-50 MG PO TABS
2.0000 | ORAL_TABLET | ORAL | Status: DC
  Administered 2017-12-10 – 2017-12-11 (×2): 2 via ORAL
  Filled 2017-12-09 (×2): qty 2

## 2017-12-09 MED ORDER — TERBUTALINE SULFATE 1 MG/ML IJ SOLN: 0.2500 mg | Freq: Once | INTRAMUSCULAR | Status: DC | PRN

## 2017-12-09 MED ORDER — SIMETHICONE 80 MG PO CHEW: 80.0000 mg | CHEWABLE_TABLET | ORAL | Status: DC | PRN

## 2017-12-09 MED ORDER — DIPHENHYDRAMINE HCL 50 MG/ML IJ SOLN: 12.5000 mg | INTRAMUSCULAR | Status: DC | PRN

## 2017-12-09 NOTE — Progress Notes (Signed)
Jill Arias is a 22 y.o. G2P1001 at [redacted]w[redacted]d by LMP admitted for active labor  Subjective: Pt comfortable with epidural.  Becoming tired with pushing.   Objective: BP 132/72   Pulse 92   Temp 98.3 F (36.8 C) (Oral)   Resp 16   LMP 03/19/2017 (Approximate)   SpO2 100%  No intake/output data recorded. No intake/output data recorded.  FHT:  FHR: 115 bpm, variability: moderate,  accelerations:  Present,  decelerations:  Present occasional late, and occasional variables with pushing, accels during contractions UC:   regular, every 2-3 minutes SVE:   Dilation: 10 Effacement (%): 100 Station: 0 Exam by:: Leftwich-Kirby  Labs: Lab Results  Component Value Date   WBC 13.5 (H) 12/09/2017   HGB 11.1 (L) 12/09/2017   HCT 33.7 (L) 12/09/2017   MCV 90.1 12/09/2017   PLT 193 12/09/2017    Assessment / Plan: Arrest of decent  GBS neg  Labor: Pt pushed x 1 hour then 1+ hour of laboring down, then restarted and pushed x 1 hour again.  Consult Dr Macon Large, pt to labor down, positioned on peanut ball for improved fetal positioning.  Dr Macon Large to assess for use of vacuum.  Preeclampsia:  n/a Fetal Wellbeing:  Category II Pain Control:  Epidural I/D:  GBS neg Anticipated MOD:  NSVD, considering vacuum assist  Sharen Counter 12/09/2017, 3:07 PM

## 2017-12-09 NOTE — Discharge Instructions (Signed)

## 2017-12-09 NOTE — Progress Notes (Signed)
Jill Arias is a 22 y.o. G2P1001 at [redacted]w[redacted]d by LMP admitted for rupture of membranes  Subjective: Pt reports intermittent abdominal pain with contractions, epidural is helping pain but pain is still present. Family in room for support.  Objective: BP 135/80   Pulse 76   Temp 98.3 F (36.8 C) (Oral)   Resp 16   LMP 03/19/2017 (Approximate)   SpO2 100%  No intake/output data recorded. No intake/output data recorded.  FHT:  FHR: 115 bpm, variability: moderate,  accelerations:  Abscent,  decelerations:  Present early UC:   regular, every 2-3 minutes SVE:   Dilation: 6 Effacement (%): 80 Station: -2 Exam by:: J.Follmer,RNC  Labs: Lab Results  Component Value Date   WBC 13.5 (H) 12/09/2017   HGB 11.1 (L) 12/09/2017   HCT 33.7 (L) 12/09/2017   MCV 90.1 12/09/2017   PLT 193 12/09/2017    Assessment / Plan: Spontaneous labor, progressing normally  Labor: Progressing normally Preeclampsia:  n/a Fetal Wellbeing:  Category I Pain Control:  Epidural I/D:  GBS neg Anticipated MOD:  NSVD  Sharen Counter 12/09/2017, 8:44 AM

## 2017-12-09 NOTE — Progress Notes (Signed)
Patient ID: Jill Arias, female   DOB: [birth year], 22 y.o.   MRN: [id number] Pt pushing x 1 hour, remains 0 to +1 station.  FHR baseline 115 with moderate variability and accels, no decels.  Plan to start Pitocin for improved contractions and labor down at this time. Encouraged maternal positions to encourage descent, like use of peanut ball.  Will reevalate in 1 hour.    Sharen Counter, CNM 11:50 AM

## 2017-12-09 NOTE — MAU Note (Signed)
Pt reports uc's since 0100 and LOF since 0200

## 2017-12-09 NOTE — Anesthesia Preprocedure Evaluation (Signed)

## 2017-12-09 NOTE — H&P (Addendum)
LABOR AND DELIVERY ADMISSION HISTORY AND PHYSICAL NOTE  Jill Arias is a 22 y.o. female G2P1001 with IUP at [redacted]w[redacted]d by LMP presenting for regular contractions and ROM around 0200 with clear fluid.  She reports positive fetal movement. She denies any other concerns, and denies any headache, blurry vision, visual disturbances, RUQ/epigastric pain, SOB, or N/V.  Prenatal History/Complications: PNC started in [place], and transferred to [facility] at 33 weeks Pregnancy complications:  - Rh negative - Cicumvallate placenta  Past Medical History: Past Medical History:  Diagnosis Date  . Medical history non-contributory     Past Surgical History: Past Surgical History:  Procedure Laterality Date  . TONSILLECTOMY      Obstetrical History: OB History    Gravida  2   Para  1   Term  1   Preterm  0   AB  0   Living  1     SAB  0   TAB  0   Ectopic  0   Multiple  0   Live Births  0           Social History: Social History   Socioeconomic History  . Marital status: Single    Spouse name: Not on file  . Number of children: Not on file  . Years of education: Not on file  . Highest education level: Not on file  Occupational History  . Not on file  Social Needs  . Financial resource strain: Not on file  . Food insecurity:    Worry: Not on file    Inability: Not on file  . Transportation needs:    Medical: Not on file    Non-medical: Not on file  Tobacco Use  . Smoking status: Never Smoker  . Smokeless tobacco: Never Used  Substance and Sexual Activity  . Alcohol use: Never    Frequency: Never  . Drug use: Not Currently    Types: Marijuana  . Sexual activity: Not Currently    Birth control/protection: None  Lifestyle  . Physical activity:    Days per week: Not on file    Minutes per session: Not on file  . Stress: Not on file  Relationships  . Social connections:    Talks on phone: Not on file    Gets together: Not on file    Attends religious  service: Not on file    Active member of club or organization: Not on file    Attends meetings of clubs or organizations: Not on file    Relationship status: Not on file  Other Topics Concern  . Not on file  Social History Narrative  . Not on file    Family History: Family History  Problem Relation Age of Onset  . Hypertension Mother     Allergies: No Known Allergies  Medications Prior to Admission  Medication Sig Dispense Refill Last Dose  . prenatal vitamin w/FE, FA (NATACHEW) 29-1 MG CHEW chewable tablet Chew 1 tablet by mouth daily at 12 noon.   Past Month at Unknown time     Review of Systems  All systems reviewed and negative except as stated in HPI  Physical Exam Blood pressure (!) 142/78, pulse 74, temperature 98.2 F (36.8 C), resp. rate 18, last menstrual period 03/19/2017, SpO2 100 %. General appearance: alert, oriented. Breathing through contractions Lungs: normal respiratory effort, no respiratory distress Heart: regular rate Abdomen: soft, non-tender; gravid, FH appropriate for GA Extremities: No LE edema. Peripheral pulses 2+  Presentation: cephalic by  SVE Fetal monitoring: baseline rate 120, moderate variability, +acel, no decel Uterine activity: ctx q 1-3 min SVE: Dilation: 4 Effacement (%): 80 Station: -3 Exam by:: B Mosca RN  Prenatal labs: ABO, Rh: A/Negative/-- (11/15 0000) Antibody: Negative (11/15 0000) Rubella: Immune (11/15 0000) RPR: Nonreactive (02/21 0000)  HBsAg: Negative (11/15 0000)  HIV: Non-reactive (02/21 0000)  GC/Chlamydia: negative GBS:   negative Glucola: normal Genetic screening:  Normal NIPS Anatomy US: normal  Prenatal Transfer Tool  Maternal Diabetes: No Genetic Screening: Normal Maternal Ultrasounds/Referrals: Normal Fetal Ultrasounds or other Referrals:  None Maternal Substance Abuse:  No Significant Maternal Medications:  None Significant Maternal Lab Results: Lab values include: Group B Strep  negative  Results for orders placed or performed during the hospital encounter of 12/09/17 (from the past 24 hour(s))  POCT fern test   Collection Time: 12/09/17  3:45 AM  Result Value Ref Range   POCT Fern Test Positive = ruptured amniotic membanes     Patient Active Problem List   Diagnosis Date Noted  . Circumvallate placenta during pregnancy in third trimester, antepartum 10/23/2017  . Rh negative state in antepartum period, third trimester 10/23/2017  . Supervision of high risk pregnancy, antepartum 10/23/2017    Assessment: Jill Arias is a 22 y.o. G2P1001 at [redacted]w[redacted]d here for SOL. SROM around 0200 this morning.  #Labor: Expectant management  #Elevated Bps: mild range, asymptomatic. Check CBC, CMP, UPC. Continue to monitor BPs  #Pain: Per patient's request. Unsure if she wants epidural #FWB: Cat I #ID:  GBS neg #MOF: breast and bottle #MOC: IUD vs Depo #Circ:  outpatient  Kandra Nicolas Degele 12/09/2017, 4:02 AM

## 2017-12-09 NOTE — Progress Notes (Signed)
Jill Arias is a 22 y.o. G2P1001 at [redacted]w[redacted]d by LMP admitted for rupture of membranes  Subjective:   Objective: BP 122/60   Pulse 85   Temp 97.6 F (36.4 C) (Oral)   Resp 16   LMP 03/19/2017 (Approximate)   SpO2 100%  No intake/output data recorded. No intake/output data recorded.  FHT:  FHR: 115 bpm, variability: moderate,  accelerations:  Present,  decelerations:  Absent UC:   regular, every 3 minutes SVE:   Dilation: 10 Effacement (%): 100 Station: 0 Exam by:: Leftwich-Kirby  ROA position noted  Labs: Lab Results  Component Value Date   WBC 13.5 (H) 12/09/2017   HGB 11.1 (L) 12/09/2017   HCT 33.7 (L) 12/09/2017   MCV 90.1 12/09/2017   PLT 193 12/09/2017    Assessment / Plan: Spontaneous labor, progressing normally  Labor: Progressing normally. Test push with pt successful. Pushed with pt x 20 minutes, offered rest, restart, pt to continue pushing at this time with RN.  Will reevaluate if still pushing in 30 minutes.   Preeclampsia:  n/a Fetal Wellbeing:  Category I Pain Control:  Epidural I/D:  GBS neg Anticipated MOD:  NSVD  Sharen Counter 12/09/2017, 11:17 AM

## 2017-12-09 NOTE — Progress Notes (Signed)
MD discussing options including pushing by pt, risks/benefits/alternatives to vacuum assisted delivery, and r/b/a to c/s.

## 2017-12-09 NOTE — Anesthesia Postprocedure Evaluation (Signed)
Anesthesia Post Note  Patient: Jill Arias  Procedure(s) Performed: AN AD HOC LABOR EPIDURAL     Patient location during evaluation: Mother Baby Anesthesia Type: Epidural Level of consciousness: awake and alert Pain management: pain level controlled Vital Signs Assessment: post-procedure vital signs reviewed and stable Respiratory status: spontaneous breathing Cardiovascular status: blood pressure returned to baseline Postop Assessment: no headache, no backache, epidural receding, adequate PO intake, no apparent nausea or vomiting and patient able to bend at knees Anesthetic complications: no    Last Vitals:  Vitals:   12/09/17 1646 12/09/17 1655  BP: (!) 127/107 136/71  Pulse: 91 81  Resp: 16 16  Temp:  36.7 C  SpO2:      Last Pain:  Vitals:   12/09/17 1655  TempSrc: Oral  PainSc:    Pain Goal:                 [name]

## 2017-12-09 NOTE — Progress Notes (Signed)
Faculty Practice OB/GYN Attending Note  Called to evaluate patient with prolonged deceleration to 90s for 5 mins after re-dosage of epidural and rapid fetal descent.  On arrival, patient is on her hands and knees position, has oxygen mask on.  Has received ephedrine dose x 1 and terbutaline x 1.  Currently, FHR is back to baseline of 120s with moderate variability, +acceleration on scalp stimulation.  Cervix is rim/100/0.  Will let fetus recover for now and start pushing soon.   Hopeful for SVD.   Jaynie Collins, MD, FACOG Obstetrician & Gynecologist, [facility] for [employer], [facility]

## 2017-12-09 NOTE — Progress Notes (Signed)
Called to room by RN for deceleration in progress down to 70s.  Pt recently received epidural dose by anesthesia and per RN exam is anterior lip/9.5 cm dilated, from 6 cm on previous exam. Pt anterior lip and 0 station on my exam. BP was 110s/70s so ephedrine x 1 dose given.  Pt repositioned to hands and knees position, O2 applied via nonrebreather face mask, and fluid bolus given.  Dr Macon Large called to room when FHR remained low, in 80s, which started to climb to 90s and was in 100s prior to MD arrival.  Terbutaline 0.25 SQ dose given x 1.  Total length of deceleration 5.5 min, preceded by accelerations and moderate variability.  FHR back to original baseline of 115 with moderate variability following decel.  Will continue to monitor/recover at this time. Will reevaluate in 1 hour.  Sharen Counter, CNM 9:57 AM

## 2017-12-09 NOTE — Anesthesia Procedure Notes (Signed)
Epidural Patient location during procedure: OB Start time: 12/09/2017 7:20 AM End time: 12/09/2017 7:28 AM  Staffing Anesthesiologist: Bethena Midget, MD  Preanesthetic Checklist Completed: patient identified, site marked, surgical consent, pre-op evaluation, timeout performed, IV checked, risks and benefits discussed and monitors and equipment checked  Epidural Patient position: sitting Prep: site prepped and draped and DuraPrep Patient monitoring: continuous pulse ox and blood pressure Approach: midline Injection technique: LOR air  Needle:  Needle type: Tuohy  Needle gauge: 17 G Needle length: 9 cm and 9 Needle insertion depth: 6 cm Catheter type: closed end flexible Catheter size: 19 Gauge Catheter at skin depth: 11 cm Test dose: negative  Assessment Events: blood not aspirated, injection not painful, no injection resistance, negative IV test and no paresthesia

## 2017-12-09 NOTE — Progress Notes (Signed)
Dr Macon Large to bedside to assess for vacuum.  Discussed options of continuing to push without assistance, use of vacuum with pt pushing, or selecting cesarean section at this time.  Pt pushed x 1 with Dr Macon Large. Fetal station +2 with pushing.  Pt becoming tired.  Pt selects vacuum assist at this time and consents to procedure.

## 2017-12-09 NOTE — Lactation Note (Signed)
This note was copied from a baby's chart. Lactation Consultation Note  Patient Name: Boy Brecklyn Galvis [id number] Date: 12/09/2017 Reason for consult: Initial assessment;Term  P2 mother whose infant is now 32 hours old.  Mother states she wanted to breastfeed but her first baby did not latch on so she gave formula.  She thought this baby did not want to latch also and only breastfed once since delivery.  I did a lot of education about breastfeeding including feeding 8-12 times/24 hours, introducing no formula and reasons why not to give formula, feeding cues, feeding intervals, supply and demand, when to expect milk to CTV, latch assistance and calling out for help if needed.  I reassured her that we are here to help with latching and to call for help.    Mother asked a few general questions regarding breastfeeding and will call RN/LC next time for help.    Mom made aware of O/P services, breastfeeding support groups, community resources, and our phone # for post-discharge questions.  Maternal Data Formula Feeding for Exclusion: No Has patient been taught Hand Expression?: Yes Does the patient have breastfeeding experience prior to this delivery?: Yes  Feeding Feeding Type: Bottle Fed - Formula Nipple Type: Slow - flow  LATCH Score                   Interventions    Lactation Tools Discussed/Used     Consult Status Consult Status: Follow-up Date: 12/10/17 Follow-up type: In-patient    Edythe Riches R Json Koelzer 12/09/2017, 9:01 PM

## 2017-12-09 NOTE — Anesthesia Pain Management Evaluation Note (Signed)
  CRNA Pain Management Visit Note  Patient: Jill Arias, 22 y.o., female  "Hello I am a member of the anesthesia team at [facility]. We have an anesthesia team available at all times to provide care throughout the hospital, including epidural management and anesthesia for C-section. I don't know your plan for the delivery whether it a natural birth, water birth, IV sedation, nitrous supplementation, doula or epidural, but we want to meet your pain goals."   1.Was your pain managed to your expectations on prior hospitalizations?   Yes   2.What is your expectation for pain management during this hospitalization?     Epidural  3.How can we help you reach that goal? Patient recently received epidural.  Record the patient's initial score and the patient's pain goal.   Pain: 10, nurse in with patient and epidural bolus has been given.   Pain Goal: 5 [facility] wants you to be able to say your pain was always managed very well.  [facility] 12/09/2017

## 2017-12-10 LAB — RPR: RPR Ser Ql: NONREACTIVE

## 2017-12-10 NOTE — Lactation Note (Signed)
This note was copied from a baby's chart. Lactation Consultation Note  Patient Name: Jill Arias Date: 12/10/2017   [facility] Follow Up Visit:  Mother ready to breastfeed and now needs latch assistance.  Infant is awake and alert as I enter.  Mother's breasts are soft and non tender.  Nipples are erect and without trauma.  Assisted infant to latch in the football hold on the right breast.  Encourage mother to wait for a wide open mouth as she wants to "force" the nipple into baby's mouth before he is ready.  Mother states some tenderness and cramping in her stomach but not a painful latch.  Lips are flanged and infant sucks in short bursts.  Taught breast compression during feeds.    Infant released from right breast and mother wants to change his diaper.  Encouraged her to attempt latching onto the left breast if he is awake and showing cues.  Mother verbalized understanding and will call for assistance as needed.  [name] [name] 12/10/2017, 5:05 AM

## 2017-12-10 NOTE — Progress Notes (Signed)
Post Partum Day 1 Subjective: no complaints, up ad lib, voiding and tolerating PO  Objective: Blood pressure 124/71, pulse 81, temperature 98.4 F (36.9 C), temperature source Oral, resp. rate 18, last menstrual period 03/19/2017, SpO2 100 %, unknown if currently breastfeeding.  Physical Exam:  General: alert, cooperative and no distress Lochia: appropriate Uterine Fundus: firm Incision: N/A DVT Evaluation: No evidence of DVT seen on physical exam. No cords or calf tenderness. No significant calf/ankle edema.  Recent Labs    12/09/17 0416  HGB 11.1*  HCT 33.7*    Assessment/Plan: Plan for discharge tomorrow  Breast and bottle feeding Does not need Rhogam; baby A neg    LOS: 1 day   Caryl Ada, DO 12/10/2017, 8:39 AM

## 2017-12-11 LAB — BIRTH TISSUE RECOVERY COLLECTION (PLACENTA DONATION)

## 2017-12-11 MED ORDER — IBUPROFEN 600 MG PO TABS: 600.0000 mg | ORAL_TABLET | Freq: Four times a day (QID) | ORAL | 0 refills | Status: DC

## 2017-12-11 NOTE — Lactation Note (Signed)
This note was copied from a baby's chart. Lactation Consultation Note  Patient Name: Jill Arias [id number] Date: 12/11/2017   Visited with Mom on day of discharge, baby 48 hrs old.  Mom choosing to formula feed baby.  Mom denied wanting any assistance with breastfeeding.  Talked about how to handle milk coming to volume.  Mom requested a hand pump incase she chooses to pump a little and give the milk to baby.   Mom aware of engorgement treatment.  Mom told of OP lactation support available to her.    Judee Clara 12/11/2017, 11:12 AM

## 2017-12-11 NOTE — Discharge Summary (Signed)
OB Discharge Summary     Patient Name: Jill Arias DOB: 1996/01/18 MRN: [id number]  Date of admission: 12/09/2017 Delivering MD: Jaynie Collins A   Date of discharge: 12/11/2017  Admitting diagnosis: 17 wks left side numbness and tingling  bad cough and severe headache Intrauterine pregnancy: [redacted]w[redacted]d     Secondary diagnosis:  Active Problems:   Normal labor   Status post vacuum-assisted vaginal delivery  Additional problems: occasional hypertension                                      Maternal exhaustion     Discharge diagnosis: Term Pregnancy Delivered                                                                                                Post partum procedures:none  Augmentation: AROM  Complications: None  Hospital course:  Onset of Labor With Vaginal Delivery  (Vacuum assisted)   22 y.o. yo G2P2002 at [redacted]w[redacted]d was admitted in Latent Labor with SROM on 12/09/2017. Patient had an uncomplicated labor course as follows:  Membrane Rupture Time/Date: 2:00 AM ,12/09/2017   Intrapartum Procedures: Episiotomy: None [1]                                         Lacerations:  2nd degree [3]  Patient had a delivery of a Viable infant. 12/09/2017  Information for the patient's newborn:  Runell, Kovich [[id number]]  Delivery Method: Vaginal, Vacuum (Extractor)(Filed from Delivery Summary)    Pateint had an uncomplicated postpartum course.  She is ambulating, tolerating a regular diet, passing flatus, and urinating well. Patient is discharged home in stable condition on 12/11/17.   Physical exam  Vitals:   12/09/17 2126 12/10/17 0506 12/10/17 1745 12/11/17 0500  BP: 130/74 124/71 125/77 129/79  Pulse: 82 81 71 66  Resp: Temp: 99.5 F (37.5 C) 98.4 F (36.9 C) 98.6 F (37 C) 97.7 F (36.5 C)  TempSrc: Oral Oral Oral Oral  SpO2: 100% 100%     General: alert, cooperative and no distress Lochia: appropriate Uterine Fundus: firm Incision: N/A DVT  Evaluation: No evidence of DVT seen on physical exam. Labs: Lab Results  Component Value Date   WBC 13.5 (H) 12/09/2017   HGB 11.1 (L) 12/09/2017   HCT 33.7 (L) 12/09/2017   MCV 90.1 12/09/2017   PLT 193 12/09/2017   CMP Latest Ref Rng & Units 12/09/2017  Glucose 65 - 99 mg/dL 94  BUN 6 - 20 mg/dL 11  Creatinine 7.25 - 3.66 mg/dL 4.40  Sodium 347 - 425 mmol/L 136  Potassium 3.5 - 5.1 mmol/L 4.0  Chloride 101 - 111 mmol/L 106  CO2 22 - 32 mmol/L 21(L)  Calcium 8.9 - 10.3 mg/dL 9.5(G)  Total Protein 6.5 - 8.1 g/dL 6.4(L)  Total Bilirubin 0.3 - 1.2 mg/dL 0.4  Alkaline Phos 38 - 126 U/L  212(H)  AST 15 - 41 U/L 21  ALT 14 - 54 U/L 13(L)    Discharge instruction: per After Visit Summary and "Baby and Me Booklet".  After visit meds:  Ibuprofen  Diet: routine diet  Activity: Advance as tolerated. Pelvic rest for 6 weeks.   Outpatient follow up:1 week for BP check Follow up Appt:No future appointments. Follow up Visit:No follow-ups on file.  Postpartum contraception: Undecided IUD or Depo  Newborn Data: Live born female  Birth Weight: 7 lb 15.3 oz (3610 g) APGAR: 7, 9  Newborn Delivery   Birth date/time:  12/09/2017 15:38:00 Delivery type:  Vaginal, Vacuum (Extractor)     Baby Feeding: Bottle and Breast Disposition:home with mother   12/11/2017 Wynelle Bourgeois, CNM

## 2017-12-11 NOTE — Lactation Note (Signed)
This note was copied from a baby's chart. Lactation Consultation Note  Patient Name: Jill Arias [id number] Date: 12/11/2017    [facility] Follow Up Visit:  Mother sleeping; will try to return later.         Martita Brumm R Colm Lyford 12/11/2017, 4:25 AM

## 2017-12-13 LAB — TYPE AND SCREEN
ABO/RH(D): A NEG
Antibody Screen: POSITIVE
UNIT DIVISION: 0
Unit division: 0

## 2017-12-13 LAB — BPAM RBC
BLOOD PRODUCT EXPIRATION DATE: 201906092359
Blood Product Expiration Date: 201906092359
Unit Type and Rh: 600
Unit Type and Rh: 600

## 2017-12-17 ENCOUNTER — Ambulatory Visit (INDEPENDENT_AMBULATORY_CARE_PROVIDER_SITE_OTHER): Payer: Medicaid Other | Admitting: *Deleted

## 2017-12-17 VITALS — BP 125/73 | HR 71 | Wt 158.0 lb

## 2017-12-17 DIAGNOSIS — Z0130 Encounter for examination of blood pressure without abnormal findings: Secondary | ICD-10-CM

## 2017-12-17 NOTE — Progress Notes (Signed)
Pt reports having mild headaches twice daily x4 days. She is not sleeping well due to caring for newborn and her other child. She has been taking ibuprofen which relieves the headaches. She denies all visual disturbances. Pt advised of reasons to return to hospital. She will need PP appt in approximately 4 weeks. MyChart sign up completed. Pt voiced understanding of all information given.

## 2017-12-25 ENCOUNTER — Telehealth: Payer: Self-pay | Admitting: General Practice

## 2017-12-25 NOTE — Telephone Encounter (Signed)
Called and notified patient in regards to FMLA forms are completed.  However, patient will need to sign ROI and pay $15 fee before forms are released.  Patient voiced understanding.

## 2018-01-07 ENCOUNTER — Encounter: Payer: Self-pay | Admitting: General Practice

## 2018-01-23 ENCOUNTER — Encounter: Payer: Self-pay | Admitting: Medical

## 2018-01-23 ENCOUNTER — Ambulatory Visit (INDEPENDENT_AMBULATORY_CARE_PROVIDER_SITE_OTHER): Payer: Medicaid Other | Admitting: Medical

## 2018-01-23 DIAGNOSIS — Z3042 Encounter for surveillance of injectable contraceptive: Secondary | ICD-10-CM | POA: Diagnosis not present

## 2018-01-23 DIAGNOSIS — Z1389 Encounter for screening for other disorder: Secondary | ICD-10-CM

## 2018-01-23 DIAGNOSIS — Z3009 Encounter for other general counseling and advice on contraception: Secondary | ICD-10-CM

## 2018-01-23 DIAGNOSIS — Z3202 Encounter for pregnancy test, result negative: Secondary | ICD-10-CM

## 2018-01-23 DIAGNOSIS — R030 Elevated blood-pressure reading, without diagnosis of hypertension: Secondary | ICD-10-CM

## 2018-01-23 LAB — POCT PREGNANCY, URINE: PREG TEST UR: NEGATIVE

## 2018-01-23 MED ORDER — IBUPROFEN 600 MG PO TABS: 600.0000 mg | ORAL_TABLET | Freq: Four times a day (QID) | ORAL | 0 refills | Status: DC

## 2018-01-23 MED ORDER — MEDROXYPROGESTERONE ACETATE 150 MG/ML IM SUSP
150.0000 mg | INTRAMUSCULAR | Status: AC
  Administered 2018-01-23: 150 mg via INTRAMUSCULAR

## 2018-01-23 NOTE — Progress Notes (Signed)
Pt. Is interested in the Depo Shot.

## 2018-01-23 NOTE — Patient Instructions (Signed)

## 2018-01-23 NOTE — Progress Notes (Signed)
Subjective:     Jill Arias is a 22 y.o. female who presents for a postpartum visit. She is 6 weeks postpartum following a spontaneous vaginal delivery. I have fully reviewed the prenatal and intrapartum course. The delivery was at 39.6 gestational weeks. Outcome: vacuum, outlet. Anesthesia: epidural. Postpartum course has been normal. Baby's course has been normal. Baby is feeding by bottle - Gerber Gentle. Bleeding red. Bowel function is normal. Bladder function is normal. Patient is not sexually active. Contraception method is abstinence. Postpartum depression screening: negative.  The following portions of the patient's history were reviewed and updated as appropriate: allergies, current medications, past family history, past medical history, past social history, past surgical history and problem list.  Review of Systems Pertinent items are noted in HPI.   Objective:    BP (!) 139/92 (BP Location: Left Arm)   Pulse 67   Ht 5\' 8"  (1.727 m)   Wt 145 lb 9.6 oz (66 kg)   Breastfeeding? No   BMI 22.14 kg/m   Repeat BP 135/100 General:  alert and cooperative   Breasts:  Deferred, not breastfeeding, no concerns  Lungs: clear to auscultation bilaterally  Heart:  regular rate and rhythm, S1, S2 normal, no murmur, click, rub or gallop  Abdomen: soft, non-tender; bowel sounds normal; no masses,  no organomegaly   Vulva:  not evaluated  Vagina: not evaluated  Cervix:  not evaluated  Corpus: not examined  Adnexa:  not evaluated  Rectal Exam: Not performed.         MDM Discussed patient with Dr. Jolayne Pantheronstant. Since she is asymptomatic today and had no HTN dx in pregnancy she should follow-up with PCP.   Assessment:     Normal postpartum exam. Pap smear not done at today's visit. Will need pap smear at 22 years old. Elevated blood pressure Unwanted fertility   Plan:    1. Contraception: Depo-Provera injections 2. [facility]  3.  [facility] 12 [facility] still active or sooner as needed.    Marny LowensteinWenzel, Julie N, PA-C 01/23/2018 11:24 AM

## 2018-04-11 ENCOUNTER — Ambulatory Visit: Payer: Medicaid Other

## 2019-02-26 ENCOUNTER — Ambulatory Visit (LOCAL_COMMUNITY_HEALTH_CENTER): Payer: Medicaid Other | Admitting: Nurse Practitioner

## 2019-02-26 ENCOUNTER — Other Ambulatory Visit: Payer: Self-pay

## 2019-02-26 ENCOUNTER — Encounter: Payer: Self-pay | Admitting: Nurse Practitioner

## 2019-02-26 VITALS — BP 115/62 | Ht 68.0 in | Wt 142.0 lb

## 2019-02-26 DIAGNOSIS — Z30011 Encounter for initial prescription of contraceptive pills: Secondary | ICD-10-CM | POA: Diagnosis not present

## 2019-02-26 DIAGNOSIS — Z3009 Encounter for other general counseling and advice on contraception: Secondary | ICD-10-CM

## 2019-02-26 LAB — PREGNANCY, URINE: Preg Test, Ur: NEGATIVE

## 2019-02-26 MED ORDER — NORGESTIMATE-ETH ESTRADIOL 0.25-35 MG-MCG PO TABS: 1.0000 | ORAL_TABLET | Freq: Every day | ORAL | 6 refills | Status: DC

## 2019-02-26 NOTE — Progress Notes (Signed)
  Family Planning Visit- Repeat Yearly Visit  Subjective:  Jill Arias is a 23 y.o. being seen today for an well woman visit and to discuss family planning options.    She is currently using Depo-Provera injections for pregnancy prevention. Patient reports she does not if she or her partner wants a pregnancy in the next year. Patient  does not have any active problems on file.  Chief Complaint  Patient presents with  . Contraception    Desires to switch from DMPA to pills    Patient reports - no current complaints  Patient denies  - any questions or concerns at this time   Does the patient desire a pregnancy in the next year? (OKQ flowsheet)  See flowsheet for other program required questions.   Body mass index is 21.59 kg/m. - Patient is eligible for diabetes screening based on BMI and age >64?  no HA1C ordered? not applicable  Patient reports 1 of partners in last year. Desires STI screening?  No -  - declines  Does the patient have a current or past history of drug use? No   No components found for: HCV]   Health Maintenance Due  Topic Date Due  . TETANUS/TDAP  07/22/2017    Review of Systems  All other systems reviewed and are negative.   The following portions of the patient's history were reviewed and updated as appropriate: allergies, current medications, past family history, past medical history, past social history, past surgical history and problem list. Problem list updated.  Objective:   Vitals:   02/26/19 1357  BP: 115/62  Weight: 142 lb (64.4 kg)  Height: 5\' 8"  (1.727 m)    Physical Exam Vitals signs reviewed.  Constitutional:      Appearance: Normal appearance. She is well-developed and normal weight.  Pulmonary:     Effort: Pulmonary effort is normal.  Skin:    General: Skin is warm and dry.  Neurological:     Mental Status: She is alert.  Psychiatric:        Behavior: Behavior is cooperative.       Assessment and Plan:  Jill Arias is a 23 y.o. female presenting to the [facility] Department for an initial well woman exam/family planning visit  Contraception counseling: Reviewed all forms of birth control options available including abstinence; over the counter/barrier methods; hormonal contraceptive medication including pill, patch, ring, injection,contraceptive implant; hormonal and nonhormonal IUDs; permanent sterilization options including vasectomy and the various tubal sterilization modalities. Risks and benefits reviewed.  Questions were answered.  Written information was also given to the patient to review.  Patient desires OCP's, this was prescribed for patient. She will follow up in  7 months for surveillance.  She was told to call with any further questions, or with any concerns about this method of contraception.  Emphasized use of condoms 100% of the time for STI prevention.  1. Encounter for initial prescription of contraceptive pills Discussed with client how to properly take OCP's and what to do if pills are missed. Advised to take 1 tab po q daily at the same time.  - norgestimate-ethinyl estradiol (ORTHO-CYCLEN) 0.25-35 MG-MCG tablet; Take 1 tablet by mouth daily.  Dispense: 1 Package; Refill: 6  2. General counseling for prescription of oral contraceptives Await UPT results - Pregnancy, urine     No follow-ups on file.  No future appointments.  Jill Andreas, NP

## 2019-02-26 NOTE — Progress Notes (Signed)
In desiring to start O.C.'s; offered EC-desires; PT today Debera Lat, RN

## 2019-02-26 NOTE — Patient Instructions (Signed)

## 2019-06-02 ENCOUNTER — Encounter: Payer: Self-pay | Admitting: Physician Assistant

## 2019-06-02 ENCOUNTER — Ambulatory Visit (LOCAL_COMMUNITY_HEALTH_CENTER): Payer: Medicaid Other | Admitting: Physician Assistant

## 2019-06-02 ENCOUNTER — Other Ambulatory Visit: Payer: Self-pay

## 2019-06-02 VITALS — BP 144/98 | Ht 68.0 in | Wt 131.0 lb

## 2019-06-02 DIAGNOSIS — R030 Elevated blood-pressure reading, without diagnosis of hypertension: Secondary | ICD-10-CM | POA: Diagnosis not present

## 2019-06-02 DIAGNOSIS — Z3009 Encounter for other general counseling and advice on contraception: Secondary | ICD-10-CM

## 2019-06-02 DIAGNOSIS — Z3041 Encounter for surveillance of contraceptive pills: Secondary | ICD-10-CM

## 2019-06-02 NOTE — Progress Notes (Signed)
Pt states she took some nighttime Robitussin last night, cold started last week.

## 2019-06-03 DIAGNOSIS — R030 Elevated blood-pressure reading, without diagnosis of hypertension: Secondary | ICD-10-CM | POA: Insufficient documentation

## 2019-06-03 MED ORDER — NORGESTIMATE-ETH ESTRADIOL 0.25-35 MG-MCG PO TABS: 1.0000 | ORAL_TABLET | Freq: Every day | ORAL | 0 refills | Status: DC

## 2019-06-03 NOTE — Progress Notes (Signed)
   Watchtower problem visit  [place] Department  Subjective:  Heena Woodbury is a 23 y.o. being seen today for more OCs.  Chief Complaint  Patient presents with  . Contraception    Pt desires OCP refill    HPI  Patient states that she is doing well with her OCs.  States that she ran out about 1 week ago.  Denies chest pain, headaches, blurred vision, shortness of breath.  Does report that she has been taking cold medicine at night and that she drank 2 Red Bull energy drinks today.   Does the patient have a current or past history of drug use? No   No components found for: HCV]   Health Maintenance Due  Topic Date Due  . TETANUS/TDAP  07/22/2017  . INFLUENZA VACCINE  03/01/2019    Review of Systems  All other systems reviewed and are negative.   The following portions of the patient's history were reviewed and updated as appropriate: allergies, current medications, past family history, past medical history, past social history, past surgical history and problem list. Problem list updated.   See flowsheet for other program required questions.  Objective:   Vitals:   06/02/19 1315 06/02/19 1332  BP: (!) 146/102 (!) 144/98  Weight: 131 lb (59.4 kg)   Height: 5\' 8"  (1.727 m)     Physical Exam Vitals signs reviewed.  Constitutional:      General: She is not in acute distress.    Appearance: Normal appearance.  HENT:     Head: Normocephalic and atraumatic.  Eyes:     Conjunctiva/sclera: Conjunctivae normal.  Pulmonary:     Effort: Pulmonary effort is normal.  Neurological:     Mental Status: She is alert and oriented to person, place, and time.  Psychiatric:        Mood and Affect: Mood normal.        Behavior: Behavior normal.        Thought Content: Thought content normal.        Judgment: Judgment normal.       Assessment and Plan:  Cherril Hett is a 23 y.o. female presenting to the [facility] Department  for a Women's Health problem visit  1. Encounter for counseling regarding contraception Counseled patient re:  Dangers of elevated BP and COCs.  Counseled that she needs to check her BP when it has been more than 4 hr since her last dose of cold medicine and last energy drink.  Counseled that is BP is still elevated, should check again 4 hrs later. Counseled that she may have to change to POCs if BP continues to be elevated. Counseled patient that I will call in one cycle of current OC to her pharmacy, but she will need to check her BP prior to starting it and if her BP is still elevated she should call and I will call in/send Rx for POCs. Counseled patient that if her BP is normal, she can start her cycle of OCs and is to call me with her BP numbers so I can call in more refills. Patient verbalized understanding of risks of clotting, CVA, DVT, and PE with elevated BP and COCs and agrees to above plan.     No follow-ups on file.  No future appointments.  Jerene Dilling, PA

## 2019-08-06 ENCOUNTER — Encounter: Payer: Self-pay | Admitting: Physician Assistant

## 2019-08-06 ENCOUNTER — Ambulatory Visit (LOCAL_COMMUNITY_HEALTH_CENTER): Payer: Medicaid Other | Admitting: Physician Assistant

## 2019-08-06 ENCOUNTER — Other Ambulatory Visit: Payer: Self-pay

## 2019-08-06 VITALS — BP 126/78 | Ht 68.0 in | Wt 126.0 lb

## 2019-08-06 DIAGNOSIS — Z0000 Encounter for general adult medical examination without abnormal findings: Secondary | ICD-10-CM | POA: Diagnosis not present

## 2019-08-06 DIAGNOSIS — Z3009 Encounter for other general counseling and advice on contraception: Secondary | ICD-10-CM

## 2019-08-06 DIAGNOSIS — Z113 Encounter for screening for infections with a predominantly sexual mode of transmission: Secondary | ICD-10-CM

## 2019-08-06 DIAGNOSIS — Z30011 Encounter for initial prescription of contraceptive pills: Secondary | ICD-10-CM

## 2019-08-06 DIAGNOSIS — Z3041 Encounter for surveillance of contraceptive pills: Secondary | ICD-10-CM

## 2019-08-06 MED ORDER — NORGESTIMATE-ETH ESTRADIOL 0.25-35 MG-MCG PO TABS: 1.0000 | ORAL_TABLET | Freq: Every day | ORAL | 12 refills | Status: DC

## 2019-08-06 MED ORDER — THERA VITAL M PO TABS: 1.0000 | ORAL_TABLET | Freq: Every day | ORAL | 0 refills | Status: AC

## 2019-08-06 NOTE — Progress Notes (Signed)
Pt here for physical and more birth control pills. Pt reports she ran out of pills for a few weeks and started those back 08/01/2019. Pt reports she has been taking them every day since then. Pt reports having unprotected sex during the time she was out of birth control pills.Lyman Speller, RN

## 2019-08-06 NOTE — Progress Notes (Signed)
Urine PT is negative today. Pt aware that provider will e-prescribe birth control pills to pharmacy on file. Pt received MVI's per pt request. Counseled pt per provider orders and pt states understanding. Provider orders completed.Lyman Speller, RN

## 2019-08-07 ENCOUNTER — Encounter: Payer: Self-pay | Admitting: Physician Assistant

## 2019-08-07 NOTE — Progress Notes (Signed)
Family Planning Visit- Repeat Yearly Visit  Subjective:  Jill Arias is a 24 y.o. being seen today for an well woman visit and to continue with OCs.    She is currently using OCP (estrogen/progesterone) for pregnancy prevention. Patient reports she does not want a pregnancy in the next year. Patient  has Elevated BP without diagnosis of hypertension on their problem list.  Chief Complaint  Patient presents with  . Contraception    refill on birth control and physical    Patient reports that she is doing well with the Ortho Cyclen and desires to continue with it as her BCM. States that she has been having trouble "keeping my weight".  States that she eats regularly.    Patient denies denies changes to personal and family history.  Denies any other concerns today.   Does the patient desire a pregnancy in the next year? (OKQ flowsheet)  See flowsheet for other program required questions.   Body mass index is 19.16 kg/m. - Patient is eligible for diabetes screening based on BMI and age >30?  not applicable QM5H ordered? not applicable  Patient reports 2 of partners in last year. Desires STI screening?  Yes  Does the patient have a current or past history of drug use? No   No components found for: HCV]   Health Maintenance Due  Topic Date Due  . TETANUS/TDAP  07/22/2017  . INFLUENZA VACCINE  03/01/2019  . PAP-Cervical Cytology Screening  07/23/2019  . PAP SMEAR-Modifier  07/23/2019    Review of Systems  All other systems reviewed and are negative.   The following portions of the patient's history were reviewed and updated as appropriate: allergies, current medications, past family history, past medical history, past social history, past surgical history and problem list. Problem list updated.  Objective:   Vitals:   08/06/19 1458  BP: 126/78  Weight: 126 lb (57.2 kg)  Height: 5\' 8"  (1.727 m)    Physical Exam Vitals and nursing note reviewed.  Constitutional:       General: She is not in acute distress.    Appearance: Normal appearance. She is normal weight.  HENT:     Head: Normocephalic and atraumatic.  Eyes:     Conjunctiva/sclera: Conjunctivae normal.  Neck:     Thyroid: No thyroid mass, thyromegaly or thyroid tenderness.  Cardiovascular:     Rate and Rhythm: Normal rate and regular rhythm.  Pulmonary:     Effort: Pulmonary effort is normal.  Abdominal:     Palpations: Abdomen is soft. There is no mass.     Tenderness: There is no abdominal tenderness. There is no guarding or rebound.  Genitourinary:    General: Normal vulva.     Rectum: Normal.     Comments: External genitalia/pubic area without nits, lice, edema, erythema, lesions and inguinal adenopathy. Vagina with normal mucosa and discharge. Cervix without visible lesions. Uterus firm, mobile, nt, no masses, no CMT, no adnexal tenderness or fullness. Musculoskeletal:     Cervical back: Neck supple.  Skin:    General: Skin is warm and dry.     Findings: No bruising, erythema, lesion or rash.  Neurological:     Mental Status: She is alert.  Psychiatric:        Mood and Affect: Mood normal.        Behavior: Behavior normal.        Thought Content: Thought content normal.        Judgment: Judgment normal.  Assessment and Plan:  Jill Arias is a 24 y.o. female presenting to the [facility] Department for a family planning visit  Contraception counseling: Reviewed all forms of birth control options in the tiered based approach. available including abstinence; over the counter/barrier methods; hormonal contraceptive medication including pill, patch, ring, injection,contraceptive implant; hormonal and nonhormonal IUDs; permanent sterilization options including vasectomy and the various tubal sterilization modalities. Risks, benefits, and typical effectiveness rates were reviewed.  Questions were answered.  Written information was also given to the patient to  review.  Patient desires to continue with OCs, this was prescribed for patient. She will follow up in  1 year and prn for surveillance.  She was told to call with any further questions, or with any concerns about this method of contraception.  Emphasized use of condoms 100% of the time for STI prevention.  Patient was offered ECP. ECP was not accepted by the patient. ECP counseling was not given - see RN documentation    1. Encounter for counseling regarding contraception Reviewed with patient normal SE of OCs and when to call clinic and d/c use. Rec condoms with all sex for STD protection. Rec that patient repeat OTC pregnancy test in 2 weeks.  RTC if pregnancy test in 2 weeks is positive. - Pregnancy, urine - Multiple Vitamins-Minerals (MULTIVITAMIN) tablet; Take 1 tablet by mouth daily.  Dispense: 100 tablet; Refill: 0  2. Surveillance of previously prescribed contraceptive pill Rx for Ortho Cyclen 28d 1 po daily at that same time each day electronically sent with refills for 1 year to patient's pharmacy. - norgestimate-ethinyl estradiol (ORTHO-CYCLEN) 0.25-35 MG-MCG tablet; Take 1 tablet by mouth daily.  Dispense: 1 Package; Refill: 12  3. Annual physical exam Counseled that RN will call or send letter re: pap results in 2-3 weeks once results are available. Enc to try drinking BOOST or Ensure between meals for added calories and weight gain. Enc to make appt with nutritionist in [facility] if changes mind for more specific tips for weight gain. Enc regular exercise, healthy diet and MVI daily for general health. Enc to follow up with PCP for illness and other concerns. - Pap IG (Image Guided)  4. Screening for STD (sexually transmitted disease) Await test results.  Counseld that RN will call if needs to RTC for treatment once results are back. - Chlamydia/Gonorrhea [facility] - HIV [facility] - Syphilis Serology, [facility]     Return for annual and PRN.  No future  appointments.  Matt Holmes, PA

## 2019-08-08 LAB — PAP IG (IMAGE GUIDED): PAP Smear Comment: 0

## 2019-08-21 LAB — PREGNANCY, URINE: Preg Test, Ur: NEGATIVE

## 2020-02-19 ENCOUNTER — Ambulatory Visit (LOCAL_COMMUNITY_HEALTH_CENTER): Payer: Medicaid Other | Admitting: Advanced Practice Midwife

## 2020-02-19 ENCOUNTER — Encounter: Payer: Self-pay | Admitting: Advanced Practice Midwife

## 2020-02-19 ENCOUNTER — Other Ambulatory Visit: Payer: Self-pay

## 2020-02-19 VITALS — BP 114/69 | Ht 68.0 in | Wt 137.0 lb

## 2020-02-19 DIAGNOSIS — Z30013 Encounter for initial prescription of injectable contraceptive: Secondary | ICD-10-CM

## 2020-02-19 DIAGNOSIS — Z3009 Encounter for other general counseling and advice on contraception: Secondary | ICD-10-CM | POA: Diagnosis not present

## 2020-02-19 MED ORDER — MEDROXYPROGESTERONE ACETATE 150 MG/ML IM SUSP
150.0000 mg | INTRAMUSCULAR | Status: DC
  Administered 2020-02-19: 150 mg via INTRAMUSCULAR

## 2020-02-19 MED ORDER — MEDROXYPROGESTERONE ACETATE 150 MG/ML IM SUSP: 150.0000 mg | INTRAMUSCULAR | Status: DC

## 2020-02-19 NOTE — Progress Notes (Signed)
     Contraception/Family Planning VISIT ENCOUNTER NOTE  Subjective:   Jill Arias is a 24 y.o.SBF nonsmoker G51P2012 female here for reproductive life counseling.  Desires wants to get on DMPA from ocp's.  Reports she does not want a pregnancy in the next year. Denies abnormal vaginal bleeding, discharge, pelvic pain, problems with intercourse or other gynecologic concerns.  Began ocp's 08/06/19 with PE and pap.  Took last ocp 02/12/20 and last sex 02/12/20 with condom.  Wants DMPA now for birth control.  Denies cigs, vaping, MJ   Gynecologic History Patient's last menstrual period was 01/23/2020. Contraception: OCP (estrogen/progesterone)  Health Maintenance Due  Topic Date Due  . Hepatitis C Screening  Never done  . TETANUS/TDAP  Never done     The following portions of the patient's history were reviewed and updated as appropriate: allergies, current medications, past family history, past medical history, past social history, past surgical history and problem list.  Review of Systems Pertinent items are noted in HPI.   Objective:  BP 114/69   Ht 5\' 8"  (1.727 m)   Wt 137 lb (62.1 kg)   LMP 01/23/2020   BMI 20.83 kg/m  Gen: well appearing, NAD HEENT: no scleral icterus    Assessment and Plan:   Contraception counseling: Reviewed all forms of birth control options in the tiered based approach. available including abstinence; over the counter/barrier methods; hormonal contraceptive medication including pill, patch, ring, injection,contraceptive implant, ECP; hormonal and nonhormonal IUDs; permanent sterilization options including vasectomy and the various tubal sterilization modalities. Risks, benefits, and typical effectiveness rates were reviewed.  Questions were answered.  Written information was also given to the patient to review.  Patient desires DMPA, this was prescribed for patient. She will follow up in 11-13 wks for surveillance.  She was told to call with any further  questions, or with any concerns about this method of contraception.  Emphasized use of condoms 100% of the time for STI prevention.  Patient was not offered ECP because not in time frame.  1. Family planning   2. Encounter for initial prescription of injectable contraceptive May have DMPA 150 mg IM q 11-13 wks until 07/2020 when needs PE.   Please refer to After Visit Summary for other counseling recommendations.   Return in 11 weeks (on 05/06/2020) for 11-13 wk DMPA.  07/06/2020, CNM [facility] DEPARTMENT

## 2020-02-19 NOTE — Progress Notes (Signed)
Here today to change birth control. Last PE and Pap Smear (NIL) here was 08/06/2019. Patient would like to discontinue pills and start Depo today. Tawny Hopping, RN

## 2020-02-19 NOTE — Progress Notes (Addendum)
Depo given per provider order. Patient tolerated well. Reviewed with patient no sex for seven days, take a home PT 02/26/20 and call health department if positive. Patient verbalized understanding of above. Tawny Hopping, RN

## 2020-03-08 ENCOUNTER — Ambulatory Visit (LOCAL_COMMUNITY_HEALTH_CENTER): Payer: Medicaid Other

## 2020-03-08 ENCOUNTER — Other Ambulatory Visit: Payer: Self-pay

## 2020-03-08 VITALS — BP 116/76 | Ht 68.0 in | Wt 137.5 lb

## 2020-03-08 DIAGNOSIS — Z3201 Encounter for pregnancy test, result positive: Secondary | ICD-10-CM

## 2020-03-08 MED ORDER — PRENATAL VITAMIN 27-0.8 MG PO TABS: 1.0000 | ORAL_TABLET | ORAL | 0 refills | Status: AC

## 2020-03-08 NOTE — Progress Notes (Signed)
Patient desires her prenatal care at [facility] in [place]. Hart Carwin, RN

## 2020-03-09 LAB — PREGNANCY, URINE: Preg Test, Ur: POSITIVE — AB

## 2020-04-27 ENCOUNTER — Other Ambulatory Visit: Payer: Self-pay

## 2020-04-27 ENCOUNTER — Ambulatory Visit (INDEPENDENT_AMBULATORY_CARE_PROVIDER_SITE_OTHER): Payer: Medicaid Other | Admitting: Nurse Practitioner

## 2020-04-27 ENCOUNTER — Encounter: Payer: Self-pay | Admitting: *Deleted

## 2020-04-27 ENCOUNTER — Encounter: Payer: Self-pay | Admitting: Nurse Practitioner

## 2020-04-27 VITALS — BP 121/61 | HR 79 | Wt 135.6 lb

## 2020-04-27 DIAGNOSIS — O0990 Supervision of high risk pregnancy, unspecified, unspecified trimester: Secondary | ICD-10-CM | POA: Insufficient documentation

## 2020-04-27 DIAGNOSIS — Z3480 Encounter for supervision of other normal pregnancy, unspecified trimester: Secondary | ICD-10-CM

## 2020-04-27 DIAGNOSIS — Z3A13 13 weeks gestation of pregnancy: Secondary | ICD-10-CM

## 2020-04-27 MED ORDER — BLOOD PRESSURE MONITORING DEVI: 1.0000 | 0 refills | Status: AC

## 2020-04-27 MED ORDER — VITAFOL GUMMIES 3.33-0.333-34.8 MG PO CHEW: 3.0000 | CHEWABLE_TABLET | Freq: Every day | ORAL | 11 refills | Status: AC

## 2020-04-27 NOTE — Patient Instructions (Signed)
[email or web address] - can go here to find a place to get the Covid vaccine

## 2020-04-27 NOTE — Progress Notes (Signed)
Subjective:   Jill Arias is a 24 y.o. G3P2002 at [redacted]w[redacted]d by LMP being seen today for her first obstetrical visit.  Her obstetrical history is significant for no. Patient does intend to breast feed. Pregnancy history fully reviewed.  Patient reports no complaints.  HISTORY: OB History  Gravida Para Term Preterm AB Living  3 2 2  0 0 2  SAB TAB Ectopic Multiple Live Births  0 0 0 0 2    # Outcome Date GA Lbr Len/2nd Weight Sex Delivery Anes PTL Lv  3 Current           2 Term 12/09/17 [redacted]w[redacted]d 09:44 / 04:54 7 lb 15.3 oz (3.61 kg) M Vag-Vacuum EPI  LIV     Name: Jill Arias     Apgar1: 7  Apgar5: 9  1 Term 2017     Vag-Spont      Past Medical History:  Diagnosis Date  . Medical history non-contributory    Past Surgical History:  Procedure Laterality Date  . TONSILLECTOMY     Family History  Problem Relation Age of Onset  . Hypertension Mother    Social History   Tobacco Use  . Smoking status: Never Smoker  . Smokeless tobacco: Never Used  Vaping Use  . Vaping Use: Never used  Substance Use Topics  . Alcohol use: Never  . Drug use: Not Currently    Types: Marijuana   No Known Allergies No current outpatient medications on file prior to visit.   No current facility-administered medications on file prior to visit.     Exam   Vitals:   04/27/20 1427  BP: 121/61  Pulse: 79  Weight: 135 lb 9.6 oz (61.5 kg)   Fetal Heart Rate (bpm): 155  Uterus:     Pelvic Exam: Perineum: Deferred pelvic   Vulva:    Vagina:     Cervix:    Adnexa:    Bony Pelvis: average  System: General: well-developed, well-nourished female in no acute distress   Breast:  normal appearance, no masses or tenderness   Skin: normal coloration and turgor, no rashes   Neurologic: oriented, normal, negative, normal mood   Extremities: normal strength, tone, and muscle mass, ROM of all joints is normal   HEENT extraocular movement intact and sclera clear, anicteric    Mouth/Teeth deferred   Neck supple and no masses, normal thyroid   Cardiovascular: regular rate and rhythm   Respiratory:  no respiratory distress, normal breath sounds   Abdomen: soft, non-tender; no masses,  no organomegaly     Assessment:   Pregnancy: 04/29/20 Patient Active Problem List   Diagnosis Date Noted  . Supervision of other normal pregnancy, antepartum 04/27/2020  . Elevated BP without diagnosis of hypertension 06/03/2019     Plan:  1. Supervision of other normal pregnancy, antepartum Plans eipdural for pain relief in labor  - CBC/D/Plt+RPR+Rh+ABO+Rub Ab... - CHL AMB BABYSCRIPTS SCHEDULE OPTIMIZATION - Culture, OB Urine - Genetic Screening - 13/09/2018 MFM OB COMP + 14 WK; Future - Hemoglobin A1c  2. [redacted] weeks gestation of pregnancy    Initial labs drawn. Continue prenatal vitamins. Genetic Screening discussed, NIPS: ordered. Ultrasound discussed; fetal anatomic survey: ordered. Problem list reviewed and updated. The nature of [facility] with multiple MDs and other Advanced Practice Providers was explained to patient; also emphasized that residents, students are part of our team. Routine obstetric precautions reviewed. Return in about 4 weeks (around 05/25/2020) for  in person ROB.  Total face-to-face time with patient: 40 minutes.  Over 50% of encounter was spent on counseling and coordination of care.     Nolene Bernheim, FNP Family Nurse Practitioner, [facility] for [employer], [facility] 04/27/2020 3:10 PM

## 2020-04-28 LAB — CBC/D/PLT+RPR+RH+ABO+RUB AB...
Antibody Screen: NEGATIVE
Basophils Absolute: 0 10*3/uL (ref 0.0–0.2)
Basos: 1 %
EOS (ABSOLUTE): 0.1 10*3/uL (ref 0.0–0.4)
Eos: 1 %
HCV Ab: <0.1 s/co ratio (ref 0.0–0.9)
HIV Screen 4th Generation wRfx: NONREACTIVE
Hematocrit: 33.6 % — ABNORMAL LOW (ref 34.0–46.6)
Hemoglobin: 11.2 g/dL (ref 11.1–15.9)
Hepatitis B Surface Ag: NEGATIVE
Immature Grans (Abs): 0 10*3/uL (ref 0.0–0.1)
Immature Granulocytes: 0 %
Lymphocytes Absolute: 1.8 10*3/uL (ref 0.7–3.1)
Lymphs: 34 %
MCH: 30 pg (ref 26.6–33.0)
MCHC: 33.3 g/dL (ref 31.5–35.7)
MCV: 90 fL (ref 79–97)
Monocytes Absolute: 0.5 10*3/uL (ref 0.1–0.9)
Monocytes: 9 %
Neutrophils Absolute: 2.9 10*3/uL (ref 1.4–7.0)
Neutrophils: 55 %
Platelets: 196 10*3/uL (ref 150–450)
RBC: 3.73 x10E6/uL — ABNORMAL LOW (ref 3.77–5.28)
RDW: 12.5 % (ref 11.7–15.4)
RPR Ser Ql: NONREACTIVE
Rh Factor: NEGATIVE
Rubella Antibodies, IGG: 3.73 index (ref >0.99)
WBC: 5.3 10*3/uL (ref 3.4–10.8)

## 2020-04-28 LAB — HEMOGLOBIN A1C
Est. average glucose Bld gHb Est-mCnc: 105 mg/dL
Hgb A1c MFr Bld: 5.3 % (ref 4.8–5.6)

## 2020-04-28 LAB — HCV INTERPRETATION

## 2020-04-29 LAB — URINE CULTURE, OB REFLEX

## 2020-04-29 LAB — CULTURE, OB URINE

## 2020-05-19 ENCOUNTER — Encounter: Payer: Self-pay | Admitting: *Deleted

## 2020-05-25 ENCOUNTER — Ambulatory Visit (INDEPENDENT_AMBULATORY_CARE_PROVIDER_SITE_OTHER): Payer: Medicaid Other | Admitting: Student

## 2020-05-25 ENCOUNTER — Other Ambulatory Visit: Payer: Self-pay

## 2020-05-25 VITALS — BP 121/77 | HR 86 | Wt 136.4 lb

## 2020-05-25 DIAGNOSIS — Z3480 Encounter for supervision of other normal pregnancy, unspecified trimester: Secondary | ICD-10-CM

## 2020-05-25 DIAGNOSIS — Z3A17 17 weeks gestation of pregnancy: Secondary | ICD-10-CM

## 2020-05-25 NOTE — Progress Notes (Signed)
   PRENATAL VISIT NOTE  Subjective:  Jill Arias is a 24 y.o. G3P2002 at [redacted]w[redacted]d being seen today for ongoing prenatal care.  She is currently monitored for the following issues for this low-risk pregnancy and has Elevated BP without diagnosis of hypertension and Supervision of other normal pregnancy, antepartum on their problem list.  Patient reports headache every day. She denies floating spots, flashing lights. She denies history of migraines. She reports that the HA seem to start during the morning and last throughout the day. She reports that she is eating enough and that she is drinking enough. Reports a lot of stress at home (her children are 2 and 4). She is getting enough sleep.  Contractions: Not present. Vag. Bleeding: None.  Movement: Absent. Denies leaking of fluid.   The following portions of the patient's history were reviewed and updated as appropriate: allergies, current medications, past family history, past medical history, past social history, past surgical history and problem list.   Objective:   Vitals:   05/25/20 1030  BP: 121/77  Pulse: 86  Weight: 136 lb 6.4 oz (61.9 kg)    Fetal Status: Fetal Heart Rate (bpm): 154   Movement: Absent     General:  Alert, oriented and cooperative. Patient is in no acute distress.  Skin: Skin is warm and dry. No rash noted.   Cardiovascular: Normal heart rate noted  Respiratory: Normal respiratory effort, no problems with respiration noted  Abdomen: Soft, gravid, appropriate for gestational age.  Pain/Pressure: Absent     Pelvic: Cervical exam deferred        Extremities: Normal range of motion.  Edema: None  Mental Status: Normal mood and affect. Normal behavior. Normal judgment and thought content.   Assessment and Plan:  Pregnancy: G3P2002 at [redacted]w[redacted]d 1. Supervision of other normal pregnancy, antepartum   2. [redacted] weeks gestation of pregnancy    -AFP today; explained AFP and why we recommend it.  -Mag oxide 400-500 for HA  recommended. Can also try Tylenol and Ibuprofen (up to 28 weeks). Decrease Mag oxide notice diarrhea.  -will get BP cuff; explained warning signs and elevated BPs and what to do if elevated.   Preterm labor symptoms and general obstetric precautions including but not limited to vaginal bleeding, contractions, leaking of fluid and fetal movement were reviewed in detail with the patient. Please refer to After Visit Summary for other counseling recommendations.   No follow-ups on file.  Future Appointments  Date Time Provider Department Center  06/04/2020  9:45 AM [facility] US4 [facility]  06/30/2020  3:15 PM Marylene Land, CNM [facility]    Marylene Land, [place]

## 2020-05-25 NOTE — Patient Instructions (Signed)
-  Try ibuprofen, tylenol for HA.  -Try to buy over the counter Magnesium oxide take 400-500 mg daily.  Decrease if you noticed diarrhea .

## 2020-05-27 LAB — AFP, SERUM, OPEN SPINA BIFIDA
AFP MoM: 0.56
AFP Value: 26.8 ng/mL
Gest. Age on Collection Date: 17.4 weeks
Maternal Age At EDD: 22.2 yr
OSBR Risk 1 IN: 10000
Test Results:: NEGATIVE
Weight: 136 [lb_av]

## 2020-06-04 ENCOUNTER — Ambulatory Visit: Payer: Medicaid Other | Attending: Nurse Practitioner

## 2020-06-04 ENCOUNTER — Other Ambulatory Visit: Payer: Self-pay | Admitting: *Deleted

## 2020-06-04 ENCOUNTER — Other Ambulatory Visit: Payer: Self-pay | Admitting: Nurse Practitioner

## 2020-06-04 ENCOUNTER — Other Ambulatory Visit: Payer: Self-pay

## 2020-06-04 DIAGNOSIS — Z3480 Encounter for supervision of other normal pregnancy, unspecified trimester: Secondary | ICD-10-CM | POA: Insufficient documentation

## 2020-06-04 DIAGNOSIS — Z362 Encounter for other antenatal screening follow-up: Secondary | ICD-10-CM

## 2020-06-30 ENCOUNTER — Encounter: Payer: Medicaid Other | Admitting: Student

## 2020-06-30 ENCOUNTER — Other Ambulatory Visit: Payer: Self-pay

## 2020-06-30 ENCOUNTER — Ambulatory Visit (INDEPENDENT_AMBULATORY_CARE_PROVIDER_SITE_OTHER): Payer: Medicaid Other | Admitting: Student

## 2020-06-30 VITALS — BP 126/73 | HR 80 | Wt 144.3 lb

## 2020-06-30 DIAGNOSIS — Z3480 Encounter for supervision of other normal pregnancy, unspecified trimester: Secondary | ICD-10-CM

## 2020-06-30 DIAGNOSIS — Z3A22 22 weeks gestation of pregnancy: Secondary | ICD-10-CM | POA: Diagnosis not present

## 2020-06-30 NOTE — Progress Notes (Signed)
   PRENATAL VISIT NOTE  Subjective:  Jill Arias is a 24 y.o. G3P2002 at [redacted]w[redacted]d being seen today for ongoing prenatal care.  She is currently monitored for the following issues for this low-risk pregnancy and has Elevated BP without diagnosis of hypertension and Supervision of other normal pregnancy, antepartum on their problem list.  Patient reports no complaints.  Contractions: Not present. Vag. Bleeding: None.  Movement: Present. Denies leaking of fluid.   The following portions of the patient's history were reviewed and updated as appropriate: allergies, current medications, past family history, past medical history, past social history, past surgical history and problem list.   Objective:   Vitals:   06/30/20 1551  BP: 126/73  Pulse: 80  Weight: 144 lb 4.8 oz (65.5 kg)    Fetal Status: Fetal Heart Rate (bpm): 155 Fundal Height: 24 cm Movement: Present     General:  Alert, oriented and cooperative. Patient is in no acute distress.  Skin: Skin is warm and dry. No rash noted.   Cardiovascular: Normal heart rate noted  Respiratory: Normal respiratory effort, no problems with respiration noted  Abdomen: Soft, gravid, appropriate for gestational age.  Pain/Pressure: Absent     Pelvic: Cervical exam deferred        Extremities: Normal range of motion.  Edema: None  Mental Status: Normal mood and affect. Normal behavior. Normal judgment and thought content.   Assessment and Plan:  Pregnancy: G3P2002 at [redacted]w[redacted]d 1. Supervision of other normal pregnancy, antepartum -follow up scan for nonvisualized anatomy scheduled -reviewed anticipatory guidance for the 28 week visit -discussed birth control methods; patient is not sure what she wants to do.   Preterm labor symptoms and general obstetric precautions including but not limited to vaginal bleeding, contractions, leaking of fluid and fetal movement were reviewed in detail with the patient. Please refer to After Visit Summary for other  counseling recommendations.   Return in about 3 weeks (around 07/21/2020), or LROB.  Future Appointments  Date Time Provider Department Center  07/02/2020  3:45 PM [facility]  07/21/2020  2:35 PM Venora Maples, MD [facility]    Marylene Land, CNM

## 2020-07-01 ENCOUNTER — Encounter: Payer: Self-pay | Admitting: *Deleted

## 2020-07-02 ENCOUNTER — Ambulatory Visit: Payer: Medicaid Other

## 2020-07-20 ENCOUNTER — Other Ambulatory Visit: Payer: Self-pay

## 2020-07-20 ENCOUNTER — Ambulatory Visit: Payer: Medicaid Other | Attending: Obstetrics

## 2020-07-20 DIAGNOSIS — Z3A25 25 weeks gestation of pregnancy: Secondary | ICD-10-CM

## 2020-07-20 DIAGNOSIS — Z362 Encounter for other antenatal screening follow-up: Secondary | ICD-10-CM | POA: Diagnosis not present

## 2020-07-21 ENCOUNTER — Other Ambulatory Visit: Payer: Self-pay | Admitting: *Deleted

## 2020-07-21 ENCOUNTER — Encounter: Payer: Self-pay | Admitting: Family Medicine

## 2020-07-21 ENCOUNTER — Ambulatory Visit (INDEPENDENT_AMBULATORY_CARE_PROVIDER_SITE_OTHER): Payer: Medicaid Other | Admitting: Family Medicine

## 2020-07-21 VITALS — BP 126/68 | HR 76 | Wt 147.9 lb

## 2020-07-21 DIAGNOSIS — Z6791 Unspecified blood type, Rh negative: Secondary | ICD-10-CM

## 2020-07-21 DIAGNOSIS — O26899 Other specified pregnancy related conditions, unspecified trimester: Secondary | ICD-10-CM

## 2020-07-21 DIAGNOSIS — O43199 Other malformation of placenta, unspecified trimester: Secondary | ICD-10-CM | POA: Insufficient documentation

## 2020-07-21 DIAGNOSIS — Z3480 Encounter for supervision of other normal pregnancy, unspecified trimester: Secondary | ICD-10-CM

## 2020-07-21 NOTE — Patient Instructions (Signed)
 Second Trimester of Pregnancy The second trimester is from week 14 through week 27 (months 4 through 6). The second trimester is often a time when you feel your best. Your body has adjusted to being pregnant, and you begin to feel better physically. Usually, morning sickness has lessened or quit completely, you may have more energy, and you may have an increase in appetite. The second trimester is also a time when the fetus is growing rapidly. At the end of the sixth month, the fetus is about 9 inches long and weighs about 1 pounds. You will likely begin to feel the baby move (quickening) between 16 and 20 weeks of pregnancy. Body changes during your second trimester Your body continues to go through many changes during your second trimester. The changes vary from woman to woman.  Your weight will continue to increase. You will notice your lower abdomen bulging out.  You may begin to get stretch marks on your hips, abdomen, and breasts.  You may develop headaches that can be relieved by medicines. The medicines should be approved by your health care provider.  You may urinate more often because the fetus is pressing on your bladder.  You may develop or continue to have heartburn as a result of your pregnancy.  You may develop constipation because certain hormones are causing the muscles that push waste through your intestines to slow down.  You may develop hemorrhoids or swollen, bulging veins (varicose veins).  You may have back pain. This is caused by: ? Weight gain. ? Pregnancy hormones that are relaxing the joints in your pelvis. ? A shift in weight and the muscles that support your balance.  Your breasts will continue to grow and they will continue to become tender.  Your gums may bleed and may be sensitive to brushing and flossing.  Dark spots or blotches (chloasma, mask of pregnancy) may develop on your face. This will likely fade after the baby is born.  A dark line from  your belly button to the pubic area (linea nigra) may appear. This will likely fade after the baby is born.  You may have changes in your hair. These can include thickening of your hair, rapid growth, and changes in texture. Some women also have hair loss during or after pregnancy, or hair that feels dry or thin. Your hair will most likely return to normal after your baby is born. What to expect at prenatal visits During a routine prenatal visit:  You will be weighed to make sure you and the fetus are growing normally.  Your blood pressure will be taken.  Your abdomen will be measured to track your baby's growth.  The fetal heartbeat will be listened to.  Any test results from the previous visit will be discussed. Your health care provider may ask you:  How you are feeling.  If you are feeling the baby move.  If you have had any abnormal symptoms, such as leaking fluid, bleeding, severe headaches, or abdominal cramping.  If you are using any tobacco products, including cigarettes, chewing tobacco, and electronic cigarettes.  If you have any questions. Other tests that may be performed during your second trimester include:  Blood tests that check for: ? Low iron levels (anemia). ? High blood sugar that affects pregnant women (gestational diabetes) between 24 and 28 weeks. ? Rh antibodies. This is to check for a protein on red blood cells (Rh factor).  Urine tests to check for infections, diabetes, or protein in   the urine.  An ultrasound to confirm the proper growth and development of the baby.  An amniocentesis to check for possible genetic problems.  Fetal screens for spina bifida and Down syndrome.  HIV (human immunodeficiency virus) testing. Routine prenatal testing includes screening for HIV, unless you choose not to have this test. Follow these instructions at home: Medicines  Follow your health care provider's instructions regarding medicine use. Specific medicines  may be either safe or unsafe to take during pregnancy.  Take a prenatal vitamin that contains at least 600 micrograms (mcg) of folic acid.  If you develop constipation, try taking a stool softener if your health care provider approves. Eating and drinking   Eat a balanced diet that includes fresh fruits and vegetables, whole grains, good sources of protein such as meat, eggs, or tofu, and low-fat dairy. Your health care provider will help you determine the amount of weight gain that is right for you.  Avoid raw meat and uncooked cheese. These carry germs that can cause birth defects in the baby.  If you have low calcium intake from food, talk to your health care provider about whether you should take a daily calcium supplement.  Limit foods that are high in fat and processed sugars, such as fried and sweet foods.  To prevent constipation: ? Drink enough fluid to keep your urine clear or pale yellow. ? Eat foods that are high in fiber, such as fresh fruits and vegetables, whole grains, and beans. Activity  Exercise only as directed by your health care provider. Most women can continue their usual exercise routine during pregnancy. Try to exercise for 30 minutes at least 5 days a week. Stop exercising if you experience uterine contractions.  Avoid heavy lifting, wear low heel shoes, and practice good posture.  A sexual relationship may be continued unless your health care provider directs you otherwise. Relieving pain and discomfort  Wear a good support bra to prevent discomfort from breast tenderness.  Take warm sitz baths to soothe any pain or discomfort caused by hemorrhoids. Use hemorrhoid cream if your health care provider approves.  Rest with your legs elevated if you have leg cramps or low back pain.  If you develop varicose veins, wear support hose. Elevate your feet for 15 minutes, 3-4 times a day. Limit salt in your diet. Prenatal Care  Write down your questions. Take  them to your prenatal visits.  Keep all your prenatal visits as told by your health care provider. This is important. Safety  Wear your seat belt at all times when driving.  Make a list of emergency phone numbers, including numbers for family, friends, the hospital, and police and fire departments. General instructions  Ask your health care provider for a referral to a local prenatal education class. Begin classes no later than the beginning of month 6 of your pregnancy.  Ask for help if you have counseling or nutritional needs during pregnancy. Your health care provider can offer advice or refer you to specialists for help with various needs.  Do not use hot tubs, steam rooms, or saunas.  Do not douche or use tampons or scented sanitary pads.  Do not cross your legs for long periods of time.  Avoid cat litter boxes and soil used by cats. These carry germs that can cause birth defects in the baby and possibly loss of the fetus by miscarriage or stillbirth.  Avoid all smoking, herbs, alcohol, and unprescribed drugs. Chemicals in these products can affect the   formation and growth of the baby.  Do not use any products that contain nicotine or tobacco, such as cigarettes and e-cigarettes. If you need help quitting, ask your health care provider.  Visit your dentist if you have not gone yet during your pregnancy. Use a soft toothbrush to brush your teeth and be gentle when you floss. Contact a health care provider if:  You have dizziness.  You have mild pelvic cramps, pelvic pressure, or nagging pain in the abdominal area.  You have persistent nausea, vomiting, or diarrhea.  You have a bad smelling vaginal discharge.  You have pain when you urinate. Get help right away if:  You have a fever.  You are leaking fluid from your vagina.  You have spotting or bleeding from your vagina.  You have severe abdominal cramping or pain.  You have rapid weight gain or weight loss.  You  have shortness of breath with chest pain.  You notice sudden or extreme swelling of your face, hands, ankles, feet, or legs.  You have not felt your baby move in over an hour.  You have severe headaches that do not go away when you take medicine.  You have vision changes. Summary  The second trimester is from week 14 through week 27 (months 4 through 6). It is also a time when the fetus is growing rapidly.  Your body goes through many changes during pregnancy. The changes vary from woman to woman.  Avoid all smoking, herbs, alcohol, and unprescribed drugs. These chemicals affect the formation and growth your baby.  Do not use any tobacco products, such as cigarettes, chewing tobacco, and e-cigarettes. If you need help quitting, ask your health care provider.  Contact your health care provider if you have any questions. Keep all prenatal visits as told by your health care provider. This is important. This information is not intended to replace advice given to you by your health care provider. Make sure you discuss any questions you have with your health care provider. Document Revised: 11/08/2018 Document Reviewed: 08/22/2016 [employer] Patient Education  2020 [employer]   Contraception Choices Contraception, also called birth control, refers to methods or devices that prevent pregnancy. Hormonal methods Contraceptive implant  A contraceptive implant is a thin, plastic tube that contains a hormone. It is inserted into the upper part of the arm. It can remain in place for up to 3 years. Progestin-only injections Progestin-only injections are injections of progestin, a synthetic form of the hormone progesterone. They are given every 3 months by a health care provider. Birth control pills  Birth control pills are pills that contain hormones that prevent pregnancy. They must be taken once a day, preferably at the same time each day. Birth control patch  The birth control patch  contains hormones that prevent pregnancy. It is placed on the skin and must be changed once a week for three weeks and removed on the fourth week. A prescription is needed to use this method of contraception. Vaginal ring  A vaginal ring contains hormones that prevent pregnancy. It is placed in the vagina for three weeks and removed on the fourth week. After that, the process is repeated with a new ring. A prescription is needed to use this method of contraception. Emergency contraceptive Emergency contraceptives prevent pregnancy after unprotected sex. They come in pill form and can be taken up to 5 days after sex. They work best the sooner they are taken after having sex. Most emergency contraceptives are available   without a prescription. This method should not be used as your only form of birth control. Barrier methods Female condom  A female condom is a thin sheath that is worn over the penis during sex. Condoms keep sperm from going inside a woman's body. They can be used with a spermicide to increase their effectiveness. They should be disposed after a single use. Female condom  A female condom is a soft, loose-fitting sheath that is put into the vagina before sex. The condom keeps sperm from going inside a woman's body. They should be disposed after a single use. Diaphragm  A diaphragm is a soft, dome-shaped barrier. It is inserted into the vagina before sex, along with a spermicide. The diaphragm blocks sperm from entering the uterus, and the spermicide kills sperm. A diaphragm should be left in the vagina for 6-8 hours after sex and removed within 24 hours. A diaphragm is prescribed and fitted by a health care provider. A diaphragm should be replaced every 1-2 years, after giving birth, after gaining more than 15 lb (6.8 kg), and after pelvic surgery. Cervical cap  A cervical cap is a round, soft latex or plastic cup that fits over the cervix. It is inserted into the vagina before sex, along  with spermicide. It blocks sperm from entering the uterus. The cap should be left in place for 6-8 hours after sex and removed within 48 hours. A cervical cap must be prescribed and fitted by a health care provider. It should be replaced every 2 years. Sponge  A sponge is a soft, circular piece of polyurethane foam with spermicide on it. The sponge helps block sperm from entering the uterus, and the spermicide kills sperm. To use it, you make it wet and then insert it into the vagina. It should be inserted before sex, left in for at least 6 hours after sex, and removed and thrown away within 30 hours. Spermicides Spermicides are chemicals that kill or block sperm from entering the cervix and uterus. They can come as a cream, jelly, suppository, foam, or tablet. A spermicide should be inserted into the vagina with an applicator at least 10-15 minutes before sex to allow time for it to work. The process must be repeated every time you have sex. Spermicides do not require a prescription. Intrauterine contraception Intrauterine device (IUD) An IUD is a T-shaped device that is put in a woman's uterus. There are two types:  Hormone IUD.This type contains progestin, a synthetic form of the hormone progesterone. This type can stay in place for 3-5 years.  Copper IUD.This type is wrapped in copper wire. It can stay in place for 10 years.  Permanent methods of contraception Female tubal ligation In this method, a woman's fallopian tubes are sealed, tied, or blocked during surgery to prevent eggs from traveling to the uterus. Hysteroscopic sterilization In this method, a small, flexible insert is placed into each fallopian tube. The inserts cause scar tissue to form in the fallopian tubes and block them, so sperm cannot reach an egg. The procedure takes about 3 months to be effective. Another form of birth control must be used during those 3 months. Female sterilization This is a procedure to tie off the  tubes that carry sperm (vasectomy). After the procedure, the man can still ejaculate fluid (semen). Natural planning methods Natural family planning In this method, a couple does not have sex on days when the woman could become pregnant. Calendar method This means keeping track of the length   of each menstrual cycle, identifying the days when pregnancy can happen, and not having sex on those days. Ovulation method In this method, a couple avoids sex during ovulation. Symptothermal method This method involves not having sex during ovulation. The woman typically checks for ovulation by watching changes in her temperature and in the consistency of cervical mucus. Post-ovulation method In this method, a couple waits to have sex until after ovulation. Summary  Contraception, also called birth control, means methods or devices that prevent pregnancy.  Hormonal methods of contraception include implants, injections, pills, patches, vaginal rings, and emergency contraceptives.  Barrier methods of contraception can include female condoms, female condoms, diaphragms, cervical caps, sponges, and spermicides.  There are two types of IUDs (intrauterine devices). An IUD can be put in a woman's uterus to prevent pregnancy for 3-5 years.  Permanent sterilization can be done through a procedure for males, females, or both.  Natural family planning methods involve not having sex on days when the woman could become pregnant. This information is not intended to replace advice given to you by your health care provider. Make sure you discuss any questions you have with your health care provider. Document Revised: 07/19/2017 Document Reviewed: 08/19/2016 [employer] Patient Education  2020 [employer]   Breastfeeding  Choosing to breastfeed is one of the best decisions you can make for yourself and your baby. A change in hormones during pregnancy causes your breasts to make breast milk in your milk-producing  glands. Hormones prevent breast milk from being released before your baby is born. They also prompt milk flow after birth. Once breastfeeding has begun, thoughts of your baby, as well as his or her sucking or crying, can stimulate the release of milk from your milk-producing glands. Benefits of breastfeeding Research shows that breastfeeding offers many health benefits for infants and mothers. It also offers a cost-free and convenient way to feed your baby. For your baby  Your first milk (colostrum) helps your baby's digestive system to function better.  Special cells in your milk (antibodies) help your baby to fight off infections.  Breastfed babies are less likely to develop asthma, allergies, obesity, or type 2 diabetes. They are also at lower risk for sudden infant death syndrome (SIDS).  Nutrients in breast milk are better able to meet your baby's needs compared to infant formula.  Breast milk improves your baby's brain development. For you  Breastfeeding helps to create a very special bond between you and your baby.  Breastfeeding is convenient. Breast milk costs nothing and is always available at the correct temperature.  Breastfeeding helps to burn calories. It helps you to lose the weight that you gained during pregnancy.  Breastfeeding makes your uterus return faster to its size before pregnancy. It also slows bleeding (lochia) after you give birth.  Breastfeeding helps to lower your risk of developing type 2 diabetes, osteoporosis, rheumatoid arthritis, cardiovascular disease, and breast, ovarian, uterine, and endometrial cancer later in life. Breastfeeding basics Starting breastfeeding  Find a comfortable place to sit or lie down, with your neck and back well-supported.  Place a pillow or a rolled-up blanket under your baby to bring him or her to the level of your breast (if you are seated). Nursing pillows are specially designed to help support your arms and your baby while  you breastfeed.  Make sure that your baby's tummy (abdomen) is facing your abdomen.  Gently massage your breast. With your fingertips, massage from the outer edges of your breast inward toward   the nipple. This encourages milk flow. If your milk flows slowly, you may need to continue this action during the feeding.  Support your breast with 4 fingers underneath and your thumb above your nipple (make the letter "C" with your hand). Make sure your fingers are well away from your nipple and your baby's mouth.  Stroke your baby's lips gently with your finger or nipple.  When your baby's mouth is open wide enough, quickly bring your baby to your breast, placing your entire nipple and as much of the areola as possible into your baby's mouth. The areola is the colored area around your nipple. ? More areola should be visible above your baby's upper lip than below the lower lip. ? Your baby's lips should be opened and extended outward (flanged) to ensure an adequate, comfortable latch. ? Your baby's tongue should be between his or her lower gum and your breast.  Make sure that your baby's mouth is correctly positioned around your nipple (latched). Your baby's lips should create a seal on your breast and be turned out (everted).  It is common for your baby to suck about 2-3 minutes in order to start the flow of breast milk. Latching Teaching your baby how to latch onto your breast properly is very important. An improper latch can cause nipple pain, decreased milk supply, and poor weight gain in your baby. Also, if your baby is not latched onto your nipple properly, he or she may swallow some air during feeding. This can make your baby fussy. Burping your baby when you switch breasts during the feeding can help to get rid of the air. However, teaching your baby to latch on properly is still the best way to prevent fussiness from swallowing air while breastfeeding. Signs that your baby has successfully  latched onto your nipple  Silent tugging or silent sucking, without causing you pain. Infant's lips should be extended outward (flanged).  Swallowing heard between every 3-4 sucks once your milk has started to flow (after your let-down milk reflex occurs).  Muscle movement above and in front of his or her ears while sucking. Signs that your baby has not successfully latched onto your nipple  Sucking sounds or smacking sounds from your baby while breastfeeding.  Nipple pain. If you think your baby has not latched on correctly, slip your finger into the corner of your baby's mouth to break the suction and place it between your baby's gums. Attempt to start breastfeeding again. Signs of successful breastfeeding Signs from your baby  Your baby will gradually decrease the number of sucks or will completely stop sucking.  Your baby will fall asleep.  Your baby's body will relax.  Your baby will retain a small amount of milk in his or her mouth.  Your baby will let go of your breast by himself or herself. Signs from you  Breasts that have increased in firmness, weight, and size 1-3 hours after feeding.  Breasts that are softer immediately after breastfeeding.  Increased milk volume, as well as a change in milk consistency and color by the fifth day of breastfeeding.  Nipples that are not sore, cracked, or bleeding. Signs that your baby is getting enough milk  Wetting at least 1-2 diapers during the first 24 hours after birth.  Wetting at least 5-6 diapers every 24 hours for the first week after birth. The urine should be clear or pale yellow by the age of 5 days.  Wetting 6-8 diapers every 24 hours as   your baby continues to grow and develop.  At least 3 stools in a 24-hour period by the age of 5 days. The stool should be soft and yellow.  At least 3 stools in a 24-hour period by the age of 7 days. The stool should be seedy and yellow.  No loss of weight greater than 10% of  birth weight during the first 3 days of life.  Average weight gain of 4-7 oz (113-198 g) per week after the age of 4 days.  Consistent daily weight gain by the age of 5 days, without weight loss after the age of 2 weeks. After a feeding, your baby may spit up a small amount of milk. This is normal. Breastfeeding frequency and duration Frequent feeding will help you make more milk and can prevent sore nipples and extremely full breasts (breast engorgement). Breastfeed when you feel the need to reduce the fullness of your breasts or when your baby shows signs of hunger. This is called "breastfeeding on demand." Signs that your baby is hungry include:  Increased alertness, activity, or restlessness.  Movement of the head from side to side.  Opening of the mouth when the corner of the mouth or cheek is stroked (rooting).  Increased sucking sounds, smacking lips, cooing, sighing, or squeaking.  Hand-to-mouth movements and sucking on fingers or hands.  Fussing or crying. Avoid introducing a pacifier to your baby in the first 4-6 weeks after your baby is born. After this time, you may choose to use a pacifier. Research has shown that pacifier use during the first year of a baby's life decreases the risk of sudden infant death syndrome (SIDS). Allow your baby to feed on each breast as long as he or she wants. When your baby unlatches or falls asleep while feeding from the first breast, offer the second breast. Because newborns are often sleepy in the first few weeks of life, you may need to awaken your baby to get him or her to feed. Breastfeeding times will vary from baby to baby. However, the following rules can serve as a guide to help you make sure that your baby is properly fed:  Newborns (babies 4 weeks of age or younger) may breastfeed every 1-3 hours.  Newborns should not go without breastfeeding for longer than 3 hours during the day or 5 hours during the night.  You should breastfeed  your baby a minimum of 8 times in a 24-hour period. Breast milk pumping     Pumping and storing breast milk allows you to make sure that your baby is exclusively fed your breast milk, even at times when you are unable to breastfeed. This is especially important if you go back to work while you are still breastfeeding, or if you are not able to be present during feedings. Your lactation consultant can help you find a method of pumping that works best for you and give you guidelines about how long it is safe to store breast milk. Caring for your breasts while you breastfeed Nipples can become dry, cracked, and sore while breastfeeding. The following recommendations can help keep your breasts moisturized and healthy:  Avoid using soap on your nipples.  Wear a supportive bra designed especially for nursing. Avoid wearing underwire-style bras or extremely tight bras (sports bras).  Air-dry your nipples for 3-4 minutes after each feeding.  Use only cotton bra pads to absorb leaked breast milk. Leaking of breast milk between feedings is normal.  Use lanolin on your nipples   after breastfeeding. Lanolin helps to maintain your skin's normal moisture barrier. Pure lanolin is not harmful (not toxic) to your baby. You may also hand express a few drops of breast milk and gently massage that milk into your nipples and allow the milk to air-dry. In the first few weeks after giving birth, some women experience breast engorgement. Engorgement can make your breasts feel heavy, warm, and tender to the touch. Engorgement peaks within 3-5 days after you give birth. The following recommendations can help to ease engorgement:  Completely empty your breasts while breastfeeding or pumping. You may want to start by applying warm, moist heat (in the shower or with warm, water-soaked hand towels) just before feeding or pumping. This increases circulation and helps the milk flow. If your baby does not completely empty your  breasts while breastfeeding, pump any extra milk after he or she is finished.  Apply ice packs to your breasts immediately after breastfeeding or pumping, unless this is too uncomfortable for you. To do this: ? Put ice in a plastic bag. ? Place a towel between your skin and the bag. ? Leave the ice on for 20 minutes, 2-3 times a day.  Make sure that your baby is latched on and positioned properly while breastfeeding. If engorgement persists after 48 hours of following these recommendations, contact your health care provider or a lactation consultant. Overall health care recommendations while breastfeeding  Eat 3 healthy meals and 3 snacks every day. Well-nourished mothers who are breastfeeding need an additional 450-500 calories a day. You can meet this requirement by increasing the amount of a balanced diet that you eat.  Drink enough water to keep your urine pale yellow or clear.  Rest often, relax, and continue to take your prenatal vitamins to prevent fatigue, stress, and low vitamin and mineral levels in your body (nutrient deficiencies).  Do not use any products that contain nicotine or tobacco, such as cigarettes and e-cigarettes. Your baby may be harmed by chemicals from cigarettes that pass into breast milk and exposure to secondhand smoke. If you need help quitting, ask your health care provider.  Avoid alcohol.  Do not use illegal drugs or marijuana.  Talk with your health care provider before taking any medicines. These include over-the-counter and prescription medicines as well as vitamins and herbal supplements. Some medicines that may be harmful to your baby can pass through breast milk.  It is possible to become pregnant while breastfeeding. If birth control is desired, ask your health care provider about options that will be safe while breastfeeding your baby. Where to find more information: [employer]: www.llli.org Contact a health care provider  if:  You feel like you want to stop breastfeeding or have become frustrated with breastfeeding.  Your nipples are cracked or bleeding.  Your breasts are red, tender, or warm.  You have: ? Painful breasts or nipples. ? A swollen area on either breast. ? A fever or chills. ? Nausea or vomiting. ? Drainage other than breast milk from your nipples.  Your breasts do not become full before feedings by the fifth day after you give birth.  You feel sad and depressed.  Your baby is: ? Too sleepy to eat well. ? Having trouble sleeping. ? More than 1 week old and wetting fewer than 6 diapers in a 24-hour period. ? Not gaining weight by 5 days of age.  Your baby has fewer than 3 stools in a 24-hour period.  Your baby's skin or   the white parts of his or her eyes become yellow. Get help right away if:  Your baby is overly tired (lethargic) and does not want to wake up and feed.  Your baby develops an unexplained fever. Summary  Breastfeeding offers many health benefits for infant and mothers.  Try to breastfeed your infant when he or she shows early signs of hunger.  Gently tickle or stroke your baby's lips with your finger or nipple to allow the baby to open his or her mouth. Bring the baby to your breast. Make sure that much of the areola is in your baby's mouth. Offer one side and burp the baby before you offer the other side.  Talk with your health care provider or lactation consultant if you have questions or you face problems as you breastfeed. This information is not intended to replace advice given to you by your health care provider. Make sure you discuss any questions you have with your health care provider. Document Revised: 10/11/2017 Document Reviewed: 08/18/2016 Elsevier Patient Education  2020 Elsevier Inc.  

## 2020-07-21 NOTE — Progress Notes (Signed)
   Subjective:  Jill Arias is a 24 y.o. G3P2002 at [redacted]w[redacted]d being seen today for ongoing prenatal care.  She is currently monitored for the following issues for this low-risk pregnancy and has Elevated BP without diagnosis of hypertension and Supervision of other normal pregnancy, antepartum on their problem list.  Patient reports no complaints.  Contractions: Not present. Vag. Bleeding: None.  Movement: Present. Denies leaking of fluid.   The following portions of the patient's history were reviewed and updated as appropriate: allergies, current medications, past family history, past medical history, past social history, past surgical history and problem list. Problem list updated.  Objective:   Vitals:   07/21/20 1447  BP: 126/68  Pulse: 76  Weight: 147 lb 14.4 oz (67.1 kg)    Fetal Status: Fetal Heart Rate (bpm): 145   Movement: Present     General:  Alert, oriented and cooperative. Patient is in no acute distress.  Skin: Skin is warm and dry. No rash noted.   Cardiovascular: Normal heart rate noted  Respiratory: Normal respiratory effort, no problems with respiration noted  Abdomen: Soft, gravid, appropriate for gestational age. Pain/Pressure: Absent     Pelvic: Vag. Bleeding: None     Cervical exam deferred        Extremities: Normal range of motion.  Edema: None  Mental Status: Normal mood and affect. Normal behavior. Normal judgment and thought content.   Urinalysis:      Assessment and Plan:  Pregnancy: G3P2002 at [redacted]w[redacted]d  1. Supervision of other normal pregnancy, antepartum BP and FHR normal Depo after delivery, briefly discussed nexplanon Tdap, rhogam, 28wk labs at next visit  2. Rh negative state in antepartum period Rhogam at next visit  3. Marginal cord insertion Noted on [place] yesterday, normal EFW Has f/u scheduled in 7 weeks Discussed with patient, all questions answered  Preterm labor symptoms and general obstetric precautions including but not limited to  vaginal bleeding, contractions, leaking of fluid and fetal movement were reviewed in detail with the patient. Please refer to After Visit Summary for other counseling recommendations.  Return in 4 weeks (on 08/18/2020) for [facility], ob visit.   Venora Maples, MD

## 2020-08-17 ENCOUNTER — Telehealth (INDEPENDENT_AMBULATORY_CARE_PROVIDER_SITE_OTHER): Payer: Medicaid Other | Admitting: Student

## 2020-08-17 ENCOUNTER — Other Ambulatory Visit: Payer: Self-pay

## 2020-08-17 VITALS — BP 139/84 | HR 78

## 2020-08-17 DIAGNOSIS — Z8616 Personal history of COVID-19: Secondary | ICD-10-CM | POA: Diagnosis not present

## 2020-08-17 DIAGNOSIS — O2693 Pregnancy related conditions, unspecified, third trimester: Secondary | ICD-10-CM

## 2020-08-17 DIAGNOSIS — Z3A29 29 weeks gestation of pregnancy: Secondary | ICD-10-CM | POA: Diagnosis not present

## 2020-08-17 DIAGNOSIS — Z3480 Encounter for supervision of other normal pregnancy, unspecified trimester: Secondary | ICD-10-CM

## 2020-08-17 NOTE — Progress Notes (Signed)
11:13a-Called Pt to start My Chart visit, she states having problems logging on, I asked if she has upgraded her iPhone to 15.2 & she stated no, asked if she could do so, will give time for her to upgrade & log into My Chart.  11:45a-

## 2020-08-17 NOTE — Progress Notes (Signed)
Patient ID: Jill Arias, female   DOB: 02-07-1996, 25 y.o.   MRN: [id number] I connected with Jill Arias 08/17/20 at 10:55 AM EST by: MyChart video and verified that I am speaking with the correct person using two identifiers.  Patient is located at home and provider is located at [facility]     The purpose of this virtual visit is to provide medical care while limiting exposure to the novel coronavirus. I discussed the limitations, risks, security and privacy concerns of performing an evaluation and management service by MyChart video and the availability of in person appointments. I also discussed with the patient that there may be a patient responsible charge related to this service. By engaging in this virtual visit, you consent to the provision of healthcare.  Additionally, you authorize for your insurance to be billed for the services provided during this visit.  The patient expressed understanding and agreed to proceed.  The following staff members participated in the virtual visit:  Corinda Gubler    PRENATAL VISIT NOTE  Subjective:  Jill Arias is a 25 y.o. G3P2002 at [redacted]w[redacted]d  for phone visit for ongoing prenatal care.  She is currently monitored for the following issues for this low-risk pregnancy and has Rh negative state in antepartum period; Elevated BP without diagnosis of hypertension; Supervision of other normal pregnancy, antepartum; and Marginal insertion of umbilical cord affecting management of mother on their problem list.  Patient reports no complaints. She denies headache, blurry vision.  Contractions: Not present.  .  Movement: Present. Denies leaking of fluid.   The following portions of the patient's history were reviewed and updated as appropriate: allergies, current medications, past family history, past medical history, past social history, past surgical history and problem list.   Objective:   Vitals:   08/17/20 1152  BP: 139/84  Pulse: 78    Self-Obtained  Fetal Status:     Movement: Present     Assessment and Plan:  Pregnancy: G3P2002 at [redacted]w[redacted]d 1. Supervision of other normal pregnancy, antepartum -feeling better after COVID -Did not receive antibody treatment -Reviewed anticipatory guidance for next visit, she will make appt for GTT next week.   Preterm labor symptoms and general obstetric precautions including but not limited to vaginal bleeding, contractions, leaking of fluid and fetal movement were reviewed in detail with the patient.  No follow-ups on file.  Future Appointments  Date Time Provider Department Center  09/07/2020 11:00 AM [facility] NURSE [facility]  09/07/2020 11:15 AM [facility] US2 [facility]     Time spent on virtual visit: 25  minutes  Marylene Land, CNM

## 2020-08-24 ENCOUNTER — Other Ambulatory Visit: Payer: Self-pay

## 2020-08-24 ENCOUNTER — Other Ambulatory Visit (INDEPENDENT_AMBULATORY_CARE_PROVIDER_SITE_OTHER): Payer: Medicaid Other

## 2020-08-24 DIAGNOSIS — Z3480 Encounter for supervision of other normal pregnancy, unspecified trimester: Secondary | ICD-10-CM | POA: Diagnosis not present

## 2020-08-24 MED ORDER — RHO D IMMUNE GLOBULIN 1500 UNIT/2ML IJ SOSY
300.0000 ug | PREFILLED_SYRINGE | Freq: Once | INTRAMUSCULAR | Status: AC
  Administered 2020-08-24: 300 ug via INTRAMUSCULAR

## 2020-08-24 MED ORDER — RHO D IMMUNE GLOBULIN 1500 UNIT/2ML IJ SOSY: 300.0000 ug | PREFILLED_SYRINGE | Freq: Once | INTRAMUSCULAR | Status: DC

## 2020-08-25 LAB — CBC
Hematocrit: 34.5 % (ref 34.0–46.6)
Hemoglobin: 11.8 g/dL (ref 11.1–15.9)
MCH: 30 pg (ref 26.6–33.0)
MCHC: 34.2 g/dL (ref 31.5–35.7)
MCV: 88 fL (ref 79–97)
Platelets: 335 10*3/uL (ref 150–450)
RBC: 3.93 x10E6/uL (ref 3.77–5.28)
RDW: 12 % (ref 11.7–15.4)
WBC: 8.8 10*3/uL (ref 3.4–10.8)

## 2020-08-25 LAB — GLUCOSE TOLERANCE, 2 HOURS W/ 1HR
Glucose, 1 hour: 67 mg/dL (ref 65–179)
Glucose, 2 hour: 67 mg/dL (ref 65–152)
Glucose, Fasting: 68 mg/dL (ref 65–91)

## 2020-08-25 LAB — HIV ANTIBODY (ROUTINE TESTING W REFLEX): HIV Screen 4th Generation wRfx: NONREACTIVE

## 2020-08-25 LAB — RPR: RPR Ser Ql: NONREACTIVE

## 2020-08-25 LAB — ANTIBODY SCREEN: Antibody Screen: NEGATIVE

## 2020-08-30 ENCOUNTER — Encounter: Payer: Self-pay | Admitting: Certified Nurse Midwife

## 2020-08-30 ENCOUNTER — Other Ambulatory Visit: Payer: Self-pay

## 2020-08-30 ENCOUNTER — Telehealth (INDEPENDENT_AMBULATORY_CARE_PROVIDER_SITE_OTHER): Payer: Medicaid Other | Admitting: Certified Nurse Midwife

## 2020-08-30 VITALS — BP 138/83 | HR 85

## 2020-08-30 DIAGNOSIS — Z6791 Unspecified blood type, Rh negative: Secondary | ICD-10-CM

## 2020-08-30 DIAGNOSIS — Z8616 Personal history of COVID-19: Secondary | ICD-10-CM | POA: Diagnosis not present

## 2020-08-30 DIAGNOSIS — Z3480 Encounter for supervision of other normal pregnancy, unspecified trimester: Secondary | ICD-10-CM

## 2020-08-30 DIAGNOSIS — O360130 Maternal care for anti-D [Rh] antibodies, third trimester, not applicable or unspecified: Secondary | ICD-10-CM | POA: Diagnosis not present

## 2020-08-30 DIAGNOSIS — Z3A31 31 weeks gestation of pregnancy: Secondary | ICD-10-CM

## 2020-08-30 NOTE — Progress Notes (Signed)
1:45 Jill Arias not connected virtually for her virtual visit. I called and left a message I am calling for your virtual visit. Please join virtually in the next few minutes if you can and we will start your visit. Linda,RN  I connected with  Jill Arias on 08/30/20 at  1:55 PM EST by MyChart virtual and verified that I am speaking with the correct person using two identifiers.   I discussed the limitations, risks, security and privacy concerns of performing an evaluation and management service by telephone and the availability of in person appointments. I also discussed with the patient that there may be a patient responsible charge related to this service. The patient expressed understanding and agreed to proceed.   08/30/2020  1:53 PM

## 2020-08-30 NOTE — Patient Instructions (Signed)

## 2020-08-30 NOTE — Progress Notes (Addendum)
OBSTETRICS PRENATAL VIRTUAL VISIT ENCOUNTER NOTE  Provider location: Center for [facility]   I connected with [name] on 08/30/20 at  2:05 PM EST by MyChart Video Encounter at home and verified that I am speaking with the correct person using two identifiers.   I discussed the limitations, risks, security and privacy concerns of performing an evaluation and management service virtually and the availability of in person appointments. I also discussed with the patient that there may be a patient responsible charge related to this service. The patient expressed understanding and agreed to proceed. Subjective:  Jill Arias is a 25 y.o. G3P2002 at [redacted]w[redacted]d being seen today for ongoing prenatal care.  She is currently monitored for the following issues for this low-risk pregnancy and has Rh negative state in antepartum period; Elevated BP without diagnosis of hypertension; Supervision of other normal pregnancy, antepartum; Marginal insertion of umbilical cord affecting management of mother; and History of COVID-19 on their problem list.  Patient reports vaginal irritation.  Contractions: Irregular. Vag. Bleeding: None.  Movement: Present. Denies any leaking of fluid.   The following portions of the patient's history were reviewed and updated as appropriate: allergies, current medications, past family history, past medical history, past social history, past surgical history and problem list.   Objective:   Vitals:   08/30/20 1357  BP: 138/83  Pulse: 85    Fetal Status:     Movement: Present     General:  Alert, oriented and cooperative. Patient is in no acute distress.  Respiratory: Normal respiratory effort, no problems with respiration noted  Mental Status: Normal mood and affect. Normal behavior. Normal judgment and thought content.  Rest of physical exam deferred due to type of encounter  Imaging: No results found.  Assessment and Plan:   Pregnancy: G3P2002 at [redacted]w[redacted]d 1. Supervision of other normal pregnancy, antepartum - Routine prenatal care - Anticipatory guidance on upcoming appointments with next being in person due to patient not taking BP  - Educated and discussed importance of taking BP weekly  - Reviewed 3rd trimester labs with patient  - Patient reports vaginal irritation after using different soap, discussed use of dove unscented and honey pot wipes to resolve irritation, patient verbalizes understanding   2. History of COVID-19 - diagnosed on 08/06/20 - educated and discussed fetal movement and warning signs  - discussed with patient antenatal screening after 36 weeks due to COVID+ in 3rd trimester    3. Rh negative state in antepartum period - Rhogam PP   4. [redacted] weeks gestation of pregnancy   Preterm labor symptoms and general obstetric precautions including but not limited to vaginal bleeding, contractions, leaking of fluid and fetal movement were reviewed in detail with the patient. I discussed the assessment and treatment plan with the patient. The patient was provided an opportunity to ask questions and all were answered. The patient agreed with the plan and demonstrated an understanding of the instructions. The patient was advised to call back or seek an in-person office evaluation/go to MAU at [facility] for any urgent or concerning symptoms. Please refer to After Visit Summary for other counseling recommendations.   I provided 10 minutes of face-to-face time during this encounter.  Return in about 2 weeks (around 09/13/2020) for LROB, in person, patient not taking BP at home.  Future Appointments  Date Time Provider Department Center  09/07/2020 11:00 AM [facility] NURSE [facility]  09/07/2020 11:15 AM [facility] US[facility]  Lajean Manes, [place] for [employer], [place]

## 2020-08-30 NOTE — Progress Notes (Signed)
States has not been taking blood pressure or logging into [employer]. Reminded to do this weekly. C/o irritation after using Bath and bodyworks soap in perineal area. Denies bad odor, discharge.  Discussed only using Dove unscented in perineal area.   Trena Dunavan,RN

## 2020-08-31 ENCOUNTER — Other Ambulatory Visit: Payer: Self-pay

## 2020-08-31 ENCOUNTER — Inpatient Hospital Stay (HOSPITAL_BASED_OUTPATIENT_CLINIC_OR_DEPARTMENT_OTHER): Payer: Medicaid Other

## 2020-08-31 ENCOUNTER — Inpatient Hospital Stay (HOSPITAL_COMMUNITY)
Admission: AD | Admit: 2020-08-31 | Discharge: 2020-09-02 | DRG: 832 | Disposition: A | Discharge status: Home / Self Care | Payer: Medicaid Other | Attending: Family Medicine | Admitting: Family Medicine

## 2020-08-31 ENCOUNTER — Encounter (HOSPITAL_COMMUNITY): Payer: Self-pay | Admitting: Family Medicine

## 2020-08-31 DIAGNOSIS — O43193 Other malformation of placenta, third trimester: Secondary | ICD-10-CM | POA: Diagnosis not present

## 2020-08-31 DIAGNOSIS — B373 Candidiasis of vulva and vagina: Secondary | ICD-10-CM | POA: Diagnosis present

## 2020-08-31 DIAGNOSIS — O43123 Velamentous insertion of umbilical cord, third trimester: Secondary | ICD-10-CM | POA: Diagnosis present

## 2020-08-31 DIAGNOSIS — Z3A31 31 weeks gestation of pregnancy: Secondary | ICD-10-CM

## 2020-08-31 DIAGNOSIS — Z30017 Encounter for initial prescription of implantable subdermal contraceptive: Secondary | ICD-10-CM | POA: Diagnosis not present

## 2020-08-31 DIAGNOSIS — O133 Gestational [pregnancy-induced] hypertension without significant proteinuria, third trimester: Secondary | ICD-10-CM | POA: Diagnosis present

## 2020-08-31 DIAGNOSIS — O26893 Other specified pregnancy related conditions, third trimester: Secondary | ICD-10-CM | POA: Diagnosis present

## 2020-08-31 DIAGNOSIS — Z8616 Personal history of COVID-19: Secondary | ICD-10-CM

## 2020-08-31 DIAGNOSIS — O139 Gestational [pregnancy-induced] hypertension without significant proteinuria, unspecified trimester: Secondary | ICD-10-CM

## 2020-08-31 DIAGNOSIS — O368130 Decreased fetal movements, third trimester, not applicable or unspecified: Secondary | ICD-10-CM

## 2020-08-31 DIAGNOSIS — O98813 Other maternal infectious and parasitic diseases complicating pregnancy, third trimester: Secondary | ICD-10-CM | POA: Diagnosis present

## 2020-08-31 DIAGNOSIS — B3731 Acute candidiasis of vulva and vagina: Secondary | ICD-10-CM

## 2020-08-31 DIAGNOSIS — O288 Other abnormal findings on antenatal screening of mother: Secondary | ICD-10-CM

## 2020-08-31 DIAGNOSIS — R030 Elevated blood-pressure reading, without diagnosis of hypertension: Secondary | ICD-10-CM

## 2020-08-31 DIAGNOSIS — Z6791 Unspecified blood type, Rh negative: Secondary | ICD-10-CM

## 2020-08-31 DIAGNOSIS — O4703 False labor before 37 completed weeks of gestation, third trimester: Secondary | ICD-10-CM

## 2020-08-31 DIAGNOSIS — Z3480 Encounter for supervision of other normal pregnancy, unspecified trimester: Secondary | ICD-10-CM

## 2020-08-31 DIAGNOSIS — O368190 Decreased fetal movements, unspecified trimester, not applicable or unspecified: Secondary | ICD-10-CM

## 2020-08-31 DIAGNOSIS — Z3A33 33 weeks gestation of pregnancy: Secondary | ICD-10-CM

## 2020-08-31 DIAGNOSIS — O4700 False labor before 37 completed weeks of gestation, unspecified trimester: Secondary | ICD-10-CM | POA: Diagnosis present

## 2020-08-31 LAB — CBC
HCT: 35 % — ABNORMAL LOW (ref 36.0–46.0)
HCT: 29.4 % — ABNORMAL LOW (ref 36.0–46.0)
Hemoglobin: 11.7 g/dL — ABNORMAL LOW (ref 12.0–15.0)
Hemoglobin: 10.4 g/dL — ABNORMAL LOW (ref 12.0–15.0)
MCH: 29.5 pg (ref 26.0–34.0)
MCH: 31 pg (ref 26.0–34.0)
MCHC: 33.4 g/dL (ref 30.0–36.0)
MCHC: 35.4 g/dL (ref 30.0–36.0)
MCV: 88.4 fL (ref 80.0–100.0)
MCV: 87.5 fL (ref 80.0–100.0)
Platelets: 259 10*3/uL (ref 150–400)
Platelets: 230 10*3/uL (ref 150–400)
RBC: 3.96 MIL/uL (ref 3.87–5.11)
RBC: 3.36 MIL/uL — ABNORMAL LOW (ref 3.87–5.11)
RDW: 12.5 % (ref 11.5–15.5)
RDW: 12.6 % (ref 11.5–15.5)
WBC: 9.6 10*3/uL (ref 4.0–10.5)
WBC: 10.2 10*3/uL (ref 4.0–10.5)
nRBC: 0 % (ref 0.0–0.2)
nRBC: 0 % (ref 0.0–0.2)

## 2020-08-31 LAB — URINALYSIS, ROUTINE W REFLEX MICROSCOPIC
Bilirubin Urine: NEGATIVE
Glucose, UA: NEGATIVE mg/dL
Hgb urine dipstick: NEGATIVE
Ketones, ur: NEGATIVE mg/dL
Nitrite: NEGATIVE
Protein, ur: NEGATIVE mg/dL
Specific Gravity, Urine: 1.025 (ref 1.005–1.030)
pH: 7 (ref 5.0–8.0)

## 2020-08-31 LAB — COMPREHENSIVE METABOLIC PANEL
ALT: 8 U/L (ref 0–44)
AST: 18 U/L (ref 15–41)
Albumin: 3.1 g/dL — ABNORMAL LOW (ref 3.5–5.0)
Alkaline Phosphatase: 169 U/L — ABNORMAL HIGH (ref 38–126)
Anion gap: 8 (ref 5–15)
BUN: 14 mg/dL (ref 6–20)
CO2: 22 mmol/L (ref 22–32)
Calcium: 9 mg/dL (ref 8.9–10.3)
Chloride: 106 mmol/L (ref 98–111)
Creatinine, Ser: 0.72 mg/dL (ref 0.44–1.00)
GFR, Estimated: >60 mL/min (ref >60)
Glucose, Bld: 76 mg/dL (ref 70–99)
Potassium: 3.6 mmol/L (ref 3.5–5.1)
Sodium: 136 mmol/L (ref 135–145)
Total Bilirubin: 0.4 mg/dL (ref 0.3–1.2)
Total Protein: 6.3 g/dL — ABNORMAL LOW (ref 6.5–8.1)

## 2020-08-31 LAB — WET PREP, GENITAL
Clue Cells Wet Prep HPF POC: NONE SEEN
Sperm: NONE SEEN
Trich, Wet Prep: NONE SEEN

## 2020-08-31 LAB — URINALYSIS, MICROSCOPIC (REFLEX)

## 2020-08-31 LAB — PROTEIN / CREATININE RATIO, URINE
Creatinine, Urine: 235.46 mg/dL
Protein Creatinine Ratio: 0.07 mg/mg{Cre} (ref 0.00–0.15)
Total Protein, Urine: 16 mg/dL

## 2020-08-31 MED ORDER — PRENATAL MULTIVITAMIN CH
1.0000 | ORAL_TABLET | Freq: Every day | ORAL | Status: DC
  Administered 2020-09-01: 1 via ORAL
  Filled 2020-08-31: qty 1

## 2020-08-31 MED ORDER — DOCUSATE SODIUM 100 MG PO CAPS: 100.0000 mg | ORAL_CAPSULE | Freq: Every day | ORAL | Status: DC

## 2020-08-31 MED ORDER — SODIUM CHLORIDE 0.9 % IV SOLN: 250.0000 mL | INTRAVENOUS | Status: DC | PRN

## 2020-08-31 MED ORDER — SODIUM CHLORIDE 0.9% FLUSH: 3.0000 mL | INTRAVENOUS | Status: DC | PRN

## 2020-08-31 MED ORDER — ZOLPIDEM TARTRATE 5 MG PO TABS: 5.0000 mg | ORAL_TABLET | Freq: Every evening | ORAL | Status: DC | PRN

## 2020-08-31 MED ORDER — LACTATED RINGERS IV SOLN: INTRAVENOUS | Status: DC

## 2020-08-31 MED ORDER — MAGNESIUM SULFATE BOLUS VIA INFUSION
4.0000 g | Freq: Once | INTRAVENOUS | Status: AC
  Administered 2020-08-31: 4 g via INTRAVENOUS
  Filled 2020-08-31: qty 1000

## 2020-08-31 MED ORDER — LACTATED RINGERS IV BOLUS
1000.0000 mL | Freq: Once | INTRAVENOUS | Status: AC
  Administered 2020-08-31: 1000 mL via INTRAVENOUS

## 2020-08-31 MED ORDER — MAGNESIUM SULFATE 40 GM/1000ML IV SOLN
2.0000 g/h | INTRAVENOUS | Status: AC
  Administered 2020-09-01 (×2): 2 g/h via INTRAVENOUS
  Filled 2020-08-31 (×2): qty 1000

## 2020-08-31 MED ORDER — BETAMETHASONE SOD PHOS & ACET 6 (3-3) MG/ML IJ SUSP
12.0000 mg | INTRAMUSCULAR | Status: AC
  Administered 2020-08-31 – 2020-09-01 (×2): 12 mg via INTRAMUSCULAR
  Filled 2020-08-31: qty 5

## 2020-08-31 MED ORDER — TERBUTALINE SULFATE 1 MG/ML IJ SOLN
0.2500 mg | Freq: Once | INTRAMUSCULAR | Status: AC
  Administered 2020-08-31: 0.25 mg via SUBCUTANEOUS
  Filled 2020-08-31: qty 1

## 2020-08-31 MED ORDER — ACETAMINOPHEN 325 MG PO TABS: 650.0000 mg | ORAL_TABLET | ORAL | Status: DC | PRN

## 2020-08-31 MED ORDER — CALCIUM CARBONATE ANTACID 500 MG PO CHEW: 2.0000 | CHEWABLE_TABLET | ORAL | Status: DC | PRN

## 2020-08-31 MED ORDER — SODIUM CHLORIDE 0.9% FLUSH: 3.0000 mL | Freq: Two times a day (BID) | INTRAVENOUS | Status: DC

## 2020-08-31 NOTE — MAU Note (Signed)
Presents with ctxs <5 minutes apart since 1430 this afternoon.  Denies VB or LOF.  Endorses +FM early this morning, but none since.

## 2020-08-31 NOTE — MAU Provider Note (Cosign Needed Addendum)
History     CSN: [id number]  Arrival date and time: 08/31/20 1836   Event Date/Time   First Provider Initiated Contact with Patient 08/31/20 1917      Chief Complaint  Patient presents with  . Contractions   25 y.o. [id number] @31 .4 wks presenting with ctx. Reports onset around 1430. Frequency is q5 min. Denies VB or LOF. Reports sex prior to start of ctx.  Was feeling less FM until she arrived in MAU. Reports dysuria after sex. No other urinary sx. Patient COVID positive on 08/06/2020.  OB History    Gravida  3   Para  2   Term  2   Preterm  0   AB  0   Living  2     SAB  0   IAB  0   Ectopic  0   Multiple  0   Live Births  2           Past Medical History:  Diagnosis Date  . Medical history non-contributory     Past Surgical History:  Procedure Laterality Date  . TONSILLECTOMY      Family History  Problem Relation Age of Onset  . Hypertension Mother     Social History   Tobacco Use  . Smoking status: Never Smoker  . Smokeless tobacco: Never Used  Vaping Use  . Vaping Use: Never used  Substance Use Topics  . Alcohol use: Never  . Drug use: Not Currently    Types: Marijuana    Allergies: No Known Allergies  Medications Prior to Admission  Medication Sig Dispense Refill Last Dose  . Prenatal Vit-Fe Phos-FA-Omega (VITAFOL GUMMIES) 3.33-0.333-34.8 MG CHEW Chew 3 each by mouth daily. 90 tablet 11 08/30/2020 at Unknown time  . Blood Pressure Monitoring DEVI 1 each by Does not apply route once a week. 1 each 0   . oxymetazoline (AFRIN) 0.05 % nasal spray Place 1 spray into both nostrils 2 (two) times daily.       Review of Systems  Gastrointestinal: Positive for abdominal pain (ctx).  Genitourinary: Positive for dysuria. Negative for frequency, urgency, vaginal bleeding and vaginal discharge.   Physical Exam   Blood pressure (!) 141/70, pulse 87, temperature 98.8 F (37.1 C), temperature source Oral, resp. rate 20, height 5\' 8"  (1.727 m),  weight 68.9 kg, last menstrual period 01/23/2020, SpO2 100 %. Patient Vitals for the past 24 hrs:  BP Temp Temp src Pulse Resp SpO2 Height Weight  08/31/20 2015 (!) 141/70 - - 87 - 100 % - -  08/31/20 2000 139/72 - - 87 - - - -  08/31/20 1950 - - - - - 100 % - -  08/31/20 1945 (!) 139/94 - - 75 - 100 % - -  08/31/20 1915 - - - - - 99 % - -  08/31/20 1913 140/88 - - 69 - - - -  08/31/20 1849 (!) 143/88 98.8 F (37.1 C) Oral 68 20 - - -  08/31/20 1845 - - - - - - 5\' 8"  (1.727 m) 68.9 kg    Physical Exam Vitals and nursing note reviewed. Exam conducted with a chaperone present.  Constitutional:      General: She is not in acute distress.    Appearance: Normal appearance.  HENT:     Head: Normocephalic and atraumatic.  Cardiovascular:     Rate and Rhythm: Normal rate.  Pulmonary:     Effort: Pulmonary effort is normal. No respiratory distress.  Abdominal:  Palpations: Abdomen is soft.     Tenderness: There is no abdominal tenderness.  Genitourinary:    Comments: VE: 1/60/-2, vtx Musculoskeletal:        General: Normal range of motion.     Cervical back: Normal range of motion.  Skin:    General: Skin is warm and dry.  Neurological:     General: No focal deficit present.     Mental Status: She is alert and oriented to person, place, and time.  Psychiatric:        Mood and Affect: Mood normal.        Behavior: Behavior normal.   EFM: 135 bpm, mod variability, + accels, occ variable decels Toco: 3-4  Results for orders placed or performed during the hospital encounter of 08/31/20 (from the past 24 hour(s))  Urinalysis, Routine w reflex microscopic Urine, Clean Catch     Status: Abnormal   Collection Time: 08/31/20  6:53 PM  Result Value Ref Range   Color, Urine YELLOW YELLOW   APPearance CLEAR CLEAR   Specific Gravity, Urine 1.025 1.005 - 1.030   pH 7.0 5.0 - 8.0   Glucose, UA NEGATIVE NEGATIVE mg/dL   Hgb urine dipstick NEGATIVE NEGATIVE   Bilirubin Urine NEGATIVE  NEGATIVE   Ketones, ur NEGATIVE NEGATIVE mg/dL   Protein, ur NEGATIVE NEGATIVE mg/dL   Nitrite NEGATIVE NEGATIVE   Leukocytes,Ua MODERATE (A) NEGATIVE  Urinalysis, Microscopic (reflex)     Status: Abnormal   Collection Time: 08/31/20  6:53 PM  Result Value Ref Range   RBC / HPF 0-5 0 - 5 RBC/hpf   WBC, UA 11-20 0 - 5 WBC/hpf   Bacteria, UA FEW (A) NONE SEEN   Squamous Epithelial / LPF 6-10 0 - 5   Mucus PRESENT   Wet prep, genital     Status: Abnormal   Collection Time: 08/31/20  7:24 PM  Result Value Ref Range   Yeast Wet Prep HPF POC PRESENT (A) NONE SEEN   Trich, Wet Prep NONE SEEN NONE SEEN   Clue Cells Wet Prep HPF POC NONE SEEN NONE SEEN   WBC, Wet Prep HPF POC MANY (A) NONE SEEN   Sperm NONE SEEN   CBC     Status: Abnormal   Collection Time: 08/31/20  7:34 PM  Result Value Ref Range   WBC 9.6 4.0 - 10.5 K/uL   RBC 3.96 3.87 - 5.11 MIL/uL   Hemoglobin 11.7 (L) 12.0 - 15.0 g/dL   HCT 37.1 (L) 69.6 - 78.9 %   MCV 88.4 80.0 - 100.0 fL   MCH 29.5 26.0 - 34.0 pg   MCHC 33.4 30.0 - 36.0 g/dL   RDW 38.1 01.7 - 51.0 %   Platelets 259 150 - 400 K/uL   nRBC 0.0 0.0 - 0.2 %  Comprehensive metabolic panel     Status: Abnormal   Collection Time: 08/31/20  7:34 PM  Result Value Ref Range   Sodium 136 135 - 145 mmol/L   Potassium 3.6 3.5 - 5.1 mmol/L   Chloride 106 98 - 111 mmol/L   CO2 22 22 - 32 mmol/L   Glucose, Bld 76 70 - 99 mg/dL   BUN 14 6 - 20 mg/dL   Creatinine, Ser 2.58 0.44 - 1.00 mg/dL   Calcium 9.0 8.9 - 52.7 mg/dL   Total Protein 6.3 (L) 6.5 - 8.1 g/dL   Albumin 3.1 (L) 3.5 - 5.0 g/dL   AST 18 15 - 41 U/L   ALT  8 0 - 44 U/L   Alkaline Phosphatase 169 (H) 38 - 126 U/L   Total Bilirubin 0.4 0.3 - 1.2 mg/dL   GFR, Estimated >32 >67 mL/min   Anion gap 8 5 - 15  Protein / creatinine ratio, urine     Status: None   Collection Time: 08/31/20  7:34 PM  Result Value Ref Range   Creatinine, Urine 235.46 mg/dL   Total Protein, Urine 16 mg/dL   Protein Creatinine  Ratio 0.07 0.00 - 0.15 mg/mg[Cre]   MAU Course  Procedures LR Terbutaline  MDM Labs ordered. Transfer of care given to Rand Etchison, NP Zeta Bucy, Odie Sera, NP  08/31/2020 9:07 PM   -r/o PTL -UA: mod leuks, few bacteria, sending urine for culture based on symptoms -repeat CE: 2.5/60, pt reports ctx have gotten significantly better, but have not slowed completely. -fFN: not performed by initial provider as patient had intercourse within past 24 hours -WetPrep: +yeast -GC/CT collected -EFM: non-reactive with variables       -baseline: 140       -variability: moderate       -accels: absent       -decels: variables, ?late 1932       -TOCO: few, irregular, spaced  -preeclampsia evaluation without severe range BP in MAU on admission -elevated BP of 140s/90s -symptoms include: none -CBC: H/H 11.7/35.0, platelets 259 -CMP: serum creatinine 0.72, AST/ALT 18/8 -PCr: 0.07 -next appt with MFM 09/07/2020  -NST reactive, but with significant lengths of time that are non-reactive, given recent COVID diagnosis and DFM earlier, BPP ordered -BPP: 8/8  -consulted with Dr. Shawnie Pons, will admit to [facility] for observation given cervical change  Orders Placed This Encounter  Procedures  . Wet prep, genital    Standing Status:   Standing    Number of Occurrences:   1    Order Specific Question:   Patient immune status    Answer:   Normal  . Culture, OB Urine    Standing Status:   Standing    Number of Occurrences:   1  . [place] MFM FETAL BPP WO NON STRESS    Standing Status:   Standing    Number of Occurrences:   1    Order Specific Question:   Symptom/Reason for Exam    Answer:   Decreased fetal movement [[id number]]  . Urinalysis, Routine w reflex microscopic Urine, Clean Catch    Standing Status:   Standing    Number of Occurrences:   1  . CBC    Standing Status:   Standing    Number of Occurrences:   1  . Comprehensive metabolic panel    Standing Status:   Standing    Number of  Occurrences:   1  . Protein / creatinine ratio, urine    Standing Status:   Standing    Number of Occurrences:   1  . Urinalysis, Microscopic (reflex)    Standing Status:   Standing    Number of Occurrences:   1   Meds ordered this encounter  Medications  . lactated ringers bolus 1,000 mL  . terbutaline (BRETHINE) injection 0.25 mg    Assessment and Plan   1. Threatened premature labor in third trimester   2. History of COVID-19   3. Supervision of other normal pregnancy, antepartum   4. Decreased fetal movement   5. Non-reactive NST (non-stress test)   6. [redacted] weeks gestation of pregnancy   7. Elevated blood pressure reading in  office without diagnosis of hypertension   8. Vaginal yeast infection    -admit to [facility]  Marylen Ponto, NP  9:38 PM 08/31/2020

## 2020-08-31 NOTE — H&P (Signed)
Jill Arias is a 25 y.o. female G3P2002 @31 .4 wks presenting with ctx. Reports onset around 1430. Frequency is q5 min. Denies VB or LOF. Reports sex prior to start of ctx.  Was feeling less FM until she arrived in MAU. Reports dysuria after sex. No other urinary sx. Patient COVID positive on 08/06/2020.  OB History    Gravida  3   Para  2   Term  2   Preterm  0   AB  0   Living  2     SAB  0   IAB  0   Ectopic  0   Multiple  0   Live Births  2          Past Medical History:  Diagnosis Date  . Medical history non-contributory    Past Surgical History:  Procedure Laterality Date  . TONSILLECTOMY     Family History: family history includes Hypertension in her mother. Social History:  reports that she has never smoked. She has never used smokeless tobacco. She reports previous drug use. Drug: Marijuana. She reports that she does not drink alcohol.     Maternal Diabetes: No Genetic Screening: Normal Maternal Ultrasounds/Referrals: Normal Fetal Ultrasounds or other Referrals:  Referred to Materal Fetal Medicine marginal cord insertion Maternal Substance Abuse:  No Significant Maternal Medications:  None Significant Maternal Lab Results:  Rh negative, COVID positive 08/06/2020 Other Comments:  new onset elevated BP  Review of Systems   Review of Systems  Gastrointestinal: Positive for abdominal pain (ctx).  Genitourinary: Positive for dysuria. Negative for frequency, urgency, vaginal bleeding and vaginal discharge.   History Dilation: 1 Effacement (%): 60 Station: -2 Exam by:: Jill Arias, CNM Blood pressure (!) 141/70, pulse 87, temperature 98.8 F (37.1 C), temperature source Oral, resp. rate 20, height 5\' 8"  (1.727 m), weight 68.9 kg, last menstrual period 01/23/2020, SpO2 100 %. Exam Physical Exam    Physical Exam Vitals and nursing note reviewed. Exam conducted with a chaperone present.  Constitutional:      General: She is not in acute  distress.    Appearance: Normal appearance.  HENT:     Head: Normocephalic and atraumatic.  Cardiovascular:     Rate and Rhythm: Normal rate.  Pulmonary:     Effort: Pulmonary effort is normal. No respiratory distress.  Abdominal:     Palpations: Abdomen is soft.     Tenderness: There is no abdominal tenderness.  Genitourinary:    Comments: VE: 1/60/-2, vtx Musculoskeletal:        General: Normal range of motion.     Cervical back: Normal range of motion.  Skin:    General: Skin is warm and dry.  Neurological:     General: No focal deficit present.     Mental Status: She is alert and oriented to person, place, and time.  Psychiatric:        Mood and Affect: Mood normal.        Behavior: Behavior normal.   EFM: 135 bpm, mod variability, + accels, occ variable decels Toco: 3-4  Prenatal labs: ABO, Rh: A/Negative/-- (09/28 1516) Antibody: Negative (01/25 0954) Rubella: 3.73 (09/28 1516) RPR: Non Reactive (01/25 0848)  HBsAg: Negative (09/28 1516)  HIV: Non Reactive (01/25 0848)  GBS:   not yet performed  Assessment/Plan:  1. Threatened premature labor in third trimester   2. History of COVID-19   3. Supervision of other normal pregnancy, antepartum   4. Decreased fetal movement   5.  Non-reactive NST (non-stress test)   6. [redacted] weeks gestation of pregnancy   7. Elevated blood pressure reading in office without diagnosis of hypertension   8. Vaginal yeast infection    -admit to [facility]  Jill Arias Jill Arias 08/31/2020, 9:39 PM

## 2020-09-01 DIAGNOSIS — O133 Gestational [pregnancy-induced] hypertension without significant proteinuria, third trimester: Secondary | ICD-10-CM

## 2020-09-01 DIAGNOSIS — O139 Gestational [pregnancy-induced] hypertension without significant proteinuria, unspecified trimester: Secondary | ICD-10-CM

## 2020-09-01 DIAGNOSIS — Z3A31 31 weeks gestation of pregnancy: Secondary | ICD-10-CM

## 2020-09-01 DIAGNOSIS — Z3A33 33 weeks gestation of pregnancy: Secondary | ICD-10-CM

## 2020-09-01 LAB — GC/CHLAMYDIA PROBE AMP (~~LOC~~) NOT AT ARMC
Chlamydia: NEGATIVE
Comment: NEGATIVE
Comment: NORMAL
Neisseria Gonorrhea: NEGATIVE

## 2020-09-01 LAB — MAGNESIUM: Magnesium: 1.8 mg/dL (ref 1.7–2.4)

## 2020-09-01 NOTE — Progress Notes (Signed)
Patient ID: Jill Arias, female   DOB: 07-23-1996, 25 y.o.   MRN: [id number] FACULTY PRACTICE ANTEPARTUM(COMPREHENSIVE) NOTE  Jill Arias is a 25 y.o. G3P2002 at [redacted]w[redacted]d  who is admitted for preterm contractions Fetal presentation is cephalic. Length of Stay:  1  Days  Date of admission:08/31/2020  Subjective: Patient reports feeling well and denies any more contractions. She reports good fetal movement and deneis bleeding or leakage of fluid   Vitals:  Blood pressure (!) 149/81, pulse 72, temperature 97.6 F (36.4 C), temperature source Oral, resp. rate 18, height 5\' 8"  (1.727 m), weight 68.9 kg, last menstrual period 01/23/2020, SpO2 99 %. Vitals:   09/01/20 0600 09/01/20 0700 09/01/20 0759 09/01/20 0800  BP: (!) 150/80 125/78  (!) 149/81  Pulse: 88 74  72  Resp: 16 17  18   Temp:    97.6 F (36.4 C)  TempSrc:    Oral  SpO2:   99%   Weight:      Height:       Physical Examination: GENERAL: Well-developed, well-nourished female in no acute distress.  LUNGS: Clear to auscultation bilaterally.  HEART: Regular rate and rhythm. ABDOMEN: Soft, nontender, gravid. PELVIC: Not performed EXTREMITIES: No cyanosis, clubbing, or edema, 2+ distal pulses.   Fetal Monitoring:  Baseline: 120 bpm, Variability: Good {> 6 bpm), Accelerations: Reactive and Decelerations: Absent   Reactive Toco: no contractions   Labs:  Results for orders placed or performed during the hospital encounter of 08/31/20 (from the past 24 hour(s))  Urinalysis, Routine w reflex microscopic Urine, Clean Catch   Collection Time: 08/31/20  6:53 PM  Result Value Ref Range   Color, Urine YELLOW YELLOW   APPearance CLEAR CLEAR   Specific Gravity, Urine 1.025 1.005 - 1.030   pH 7.0 5.0 - 8.0   Glucose, UA NEGATIVE NEGATIVE mg/dL   Hgb urine dipstick NEGATIVE NEGATIVE   Bilirubin Urine NEGATIVE NEGATIVE   Ketones, ur NEGATIVE NEGATIVE mg/dL   Protein, ur NEGATIVE NEGATIVE mg/dL   Nitrite NEGATIVE NEGATIVE    Leukocytes,Ua MODERATE (A) NEGATIVE  Urinalysis, Microscopic (reflex)   Collection Time: 08/31/20  6:53 PM  Result Value Ref Range   RBC / HPF 0-5 0 - 5 RBC/hpf   WBC, UA 11-20 0 - 5 WBC/hpf   Bacteria, UA FEW (A) NONE SEEN   Squamous Epithelial / LPF 6-10 0 - 5   Mucus PRESENT   Wet prep, genital   Collection Time: 08/31/20  7:24 PM  Result Value Ref Range   Yeast Wet Prep HPF POC PRESENT (A) NONE SEEN   Trich, Wet Prep NONE SEEN NONE SEEN   Clue Cells Wet Prep HPF POC NONE SEEN NONE SEEN   WBC, Wet Prep HPF POC MANY (A) NONE SEEN   Sperm NONE SEEN   CBC   Collection Time: 08/31/20  7:34 PM  Result Value Ref Range   WBC 9.6 4.0 - 10.5 K/uL   RBC 3.96 3.87 - 5.11 MIL/uL   Hemoglobin 11.7 (L) 12.0 - 15.0 g/dL   HCT 10/29/20 (L) 10/29/20 - 78.4 %   MCV 88.4 80.0 - 100.0 fL   MCH 29.5 26.0 - 34.0 pg   MCHC 33.4 30.0 - 36.0 g/dL   RDW 69.6 29.5 - 28.4 %   Platelets 259 150 - 400 K/uL   nRBC 0.0 0.0 - 0.2 %  Comprehensive metabolic panel   Collection Time: 08/31/20  7:34 PM  Result Value Ref Range   Sodium 136 135 - 145  mmol/L   Potassium 3.6 3.5 - 5.1 mmol/L   Chloride 106 98 - 111 mmol/L   CO2 22 22 - 32 mmol/L   Glucose, Bld 76 70 - 99 mg/dL   BUN 14 6 - 20 mg/dL   Creatinine, Ser 2.83 0.44 - 1.00 mg/dL   Calcium 9.0 8.9 - 66.2 mg/dL   Total Protein 6.3 (L) 6.5 - 8.1 g/dL   Albumin 3.1 (L) 3.5 - 5.0 g/dL   AST 18 15 - 41 U/L   ALT 8 0 - 44 U/L   Alkaline Phosphatase 169 (H) 38 - 126 U/L   Total Bilirubin 0.4 0.3 - 1.2 mg/dL   GFR, Estimated >94 >76 mL/min   Anion gap 8 5 - 15  Protein / creatinine ratio, urine   Collection Time: 08/31/20  7:34 PM  Result Value Ref Range   Creatinine, Urine 235.46 mg/dL   Total Protein, Urine 16 mg/dL   Protein Creatinine Ratio 0.07 0.00 - 0.15 mg/mg[Cre]  CBC on admission   Collection Time: 08/31/20 11:02 PM  Result Value Ref Range   WBC 10.2 4.0 - 10.5 K/uL   RBC 3.36 (L) 3.87 - 5.11 MIL/uL   Hemoglobin 10.4 (L) 12.0 - 15.0 g/dL    HCT 54.6 (L) 50.3 - 46.0 %   MCV 87.5 80.0 - 100.0 fL   MCH 31.0 26.0 - 34.0 pg   MCHC 35.4 30.0 - 36.0 g/dL   RDW 54.6 56.8 - 12.7 %   Platelets 230 150 - 400 K/uL   nRBC 0.0 0.0 - 0.2 %  Magnesium   Collection Time: 08/31/20 11:02 PM  Result Value Ref Range   Magnesium 1.8 1.7 - 2.4 mg/dL    Imaging Studies:    [place] MFM FETAL BPP WO NON STRESS  Result Date: 09/01/2020 ----------------------------------------------------------------------  OBSTETRICS REPORT                       (Signed Final 09/01/2020 08:13 am) ---------------------------------------------------------------------- Patient Info  ID #:       [id number]                          D.O.B.:  1995/12/29 (25 yrs)  Name:       Jill Arias                Visit Date: 08/31/2020 08:33 pm ---------------------------------------------------------------------- Performed By  Attending:        Noralee Space MD        Ref. Address:     [facility]ulty Practice  Performed By:     Jill Arias          [facility]  Referred By:      Jill Paris NP ---------------------------------------------------------------------- Orders  #  Description                           Code  Ordered By  1  [place] MFM FETAL BPP WO NON               [id number]    Jill Arias     STRESS ----------------------------------------------------------------------  #  Order #                     Accession #                Episode #  1  [id number]                   [id number]                 [id number] ---------------------------------------------------------------------- Indications  Decreased fetal movement                       O36.8190  [redacted] weeks gestation of pregnancy                Z3A.31  Marginal insertion of umbilical cord affecting O43.191  management of mother in first trimester  Encounter for antenatal screening,             Z36.9  unspecified  ---------------------------------------------------------------------- Fetal Evaluation  Num Of Fetuses:         1  Fetal Heart Rate(bpm):  141  Cardiac Activity:       Observed  Presentation:           Cephalic  Placenta:               Fundal  Amniotic Fluid  AFI FV:      Within normal limits  AFI Sum(cm)     %Tile       Largest Pocket(cm)  12.7            37          4.1  RUQ(cm)       RLQ(cm)       LUQ(cm)        LLQ(cm)  4.1           1.5           3.1            4 ---------------------------------------------------------------------- Biophysical Evaluation  Amniotic F.V:   Within normal limits       F. Tone:        Observed  F. Movement:    Observed                   Score:          8/8  F. Breathing:   Observed ---------------------------------------------------------------------- OB History  Gravidity:    3         Term:   2        Prem:   0        SAB:   0  TOP:          0       Ectopic:  0        Living: 2 ---------------------------------------------------------------------- Gestational Age  LMP:           31w 4d        Date:  01/23/20                 EDD:   10/29/20  Best:          31w 4d     Det. By:  LMP  (01/23/20)  EDD:   10/29/20 ---------------------------------------------------------------------- Anatomy  Thoracic:              Appears normal         Bladder:                Appears normal  Stomach:               Appears normal, left                         sided ---------------------------------------------------------------------- Impression  Patient was evaluated for c/o decreased fetal movements .  Amniotic fluid is normal and good fetal activity is seen  .Antenatal testing is reassuring. BPP 8/8. Cephalic  presentation. ----------------------------------------------------------------------                  Jill Space, MD Electronically Signed Final Report   09/01/2020 08:13 am ----------------------------------------------------------------------    Medications:  Scheduled .  betamethasone acetate-betamethasone sodium phosphate  12 mg Intramuscular Q24 Hr x 2  . docusate sodium  100 mg Oral Daily  . prenatal multivitamin  1 tablet Oral Q1200  . sodium chloride flush  3 mL Intravenous Q12H   I have reviewed the patient's current medications.  ASSESSMENT: [id number] [redacted]w[redacted]d Estimated Date of Delivery: 10/29/20  Patient Active Problem List   Diagnosis Date Noted  . Gestational hypertension 09/01/2020  . Preterm contractions 08/31/2020  . History of COVID-19 08/30/2020  . Marginal insertion of umbilical cord affecting management of mother 07/21/2020  . Supervision of other normal pregnancy, antepartum 04/27/2020  . Elevated BP without diagnosis of hypertension 06/03/2019  . Rh negative state in antepartum period 10/23/2017    PLAN: - Continue tocolysis with magnesium sulfate until completion of steroid course - Second BMZ due this evening - Continue monitoring BP- new diagnosis of GHTN - Continue current care  Zymere Patlan 09/01/2020,8:55 AM

## 2020-09-02 LAB — CULTURE, OB URINE: Culture: <10000 — AB

## 2020-09-02 NOTE — Discharge Summary (Addendum)
Physician Discharge Summary  Patient ID: Jill Arias MRN: [id number] DOB/AGE: January 22, 1996 24 y.o.  Admit date: 08/31/2020 Discharge date: 09/02/2020  Admission Diagnoses:Preterm contractions  Discharge Diagnoses:  Active Problems:   Preterm contractions   Gestational hypertension   Discharged Condition: good  Hospital Course: Patient admitted with preterm contractions and cervical change. Patient received tocolysis with magnesium sulfate and completed a course of BMZ. Preterm contractions subsided. Patient was also noted to have elevated BP on several occasions as high as 158/89, meeting diagnosis criteria for gestational hypertension. All labs were normal including P:C 0.07. Patient found stable for discharge. Precautions reviewed with the patient. Discussed delivery by 37 weeks  Consults: None   Discharge Exam: Blood pressure 132/78, pulse 68, temperature 98.5 F (36.9 C), temperature source Oral, resp. rate 17, height 5\' 8"  (1.727 m), weight 68.9 kg, last menstrual period 01/23/2020, SpO2 99 %. GENERAL: Well-developed, well-nourished female in no acute distress.  LUNGS: Clear to auscultation bilaterally.  HEART: Regular rate and rhythm. ABDOMEN: Soft, nontender, gravid PELVIC: cervix 1/thick EXTREMITIES: No cyanosis, clubbing, or edema, 2+ distal pulses.  FHT: baseline 125, mod variability, +accels, no decels  Disposition: home   Allergies as of 09/02/2020   No Known Allergies     Medication List    TAKE these medications   acetaminophen 500 MG tablet Commonly known as: TYLENOL Take 500-1,000 mg by mouth every 6 (six) hours as needed for mild pain or headache.   Blood Pressure Monitoring Devi 1 each by Does not apply route once a week.   Vitafol Gummies 3.33-0.333-34.8 MG Chew Chew 3 each by mouth daily.   vitamin C 250 MG tablet Commonly known as: ASCORBIC ACID Take 500 mg by mouth daily.       Follow-up Information    [facility]  [facility] Follow up.   Specialty: Obstetrics and Gynecology Why: As scheduled on 2/15 Contact information: [address] [place] [phone]              Signed: [phone] 09/02/2020, 9:05 AM

## 2020-09-02 NOTE — Progress Notes (Signed)
Discharge instructions given to pt. Discussed signs and symptoms to report to the MD, upcoming appointments, and meds. Pt verbalizes understanding and has no questions or concerns at this time. Pt discharged home from hospital in stable condition. 

## 2020-09-07 ENCOUNTER — Other Ambulatory Visit: Payer: Self-pay

## 2020-09-07 ENCOUNTER — Inpatient Hospital Stay (HOSPITAL_COMMUNITY)
Admission: AD | Admit: 2020-09-07 | Discharge: 2020-09-12 | DRG: 788 | Disposition: A | Discharge status: Home / Self Care | Payer: Medicaid Other | Attending: Family Medicine | Admitting: Family Medicine

## 2020-09-07 ENCOUNTER — Ambulatory Visit (HOSPITAL_BASED_OUTPATIENT_CLINIC_OR_DEPARTMENT_OTHER): Payer: Medicaid Other

## 2020-09-07 ENCOUNTER — Ambulatory Visit: Payer: Medicaid Other | Admitting: *Deleted

## 2020-09-07 ENCOUNTER — Other Ambulatory Visit: Payer: Self-pay | Admitting: Obstetrics

## 2020-09-07 ENCOUNTER — Encounter: Payer: Self-pay | Admitting: *Deleted

## 2020-09-07 ENCOUNTER — Encounter (HOSPITAL_COMMUNITY): Payer: Self-pay | Admitting: Family Medicine

## 2020-09-07 DIAGNOSIS — O365930 Maternal care for other known or suspected poor fetal growth, third trimester, not applicable or unspecified: Secondary | ICD-10-CM | POA: Diagnosis present

## 2020-09-07 DIAGNOSIS — O43193 Other malformation of placenta, third trimester: Secondary | ICD-10-CM | POA: Diagnosis not present

## 2020-09-07 DIAGNOSIS — Z369 Encounter for antenatal screening, unspecified: Secondary | ICD-10-CM | POA: Diagnosis not present

## 2020-09-07 DIAGNOSIS — Z3480 Encounter for supervision of other normal pregnancy, unspecified trimester: Secondary | ICD-10-CM | POA: Insufficient documentation

## 2020-09-07 DIAGNOSIS — O43199 Other malformation of placenta, unspecified trimester: Secondary | ICD-10-CM | POA: Insufficient documentation

## 2020-09-07 DIAGNOSIS — Z30017 Encounter for initial prescription of implantable subdermal contraceptive: Secondary | ICD-10-CM | POA: Diagnosis not present

## 2020-09-07 DIAGNOSIS — O43123 Velamentous insertion of umbilical cord, third trimester: Secondary | ICD-10-CM | POA: Diagnosis present

## 2020-09-07 DIAGNOSIS — Z8759 Personal history of other complications of pregnancy, childbirth and the puerperium: Secondary | ICD-10-CM | POA: Diagnosis present

## 2020-09-07 DIAGNOSIS — O26893 Other specified pregnancy related conditions, third trimester: Secondary | ICD-10-CM | POA: Diagnosis present

## 2020-09-07 DIAGNOSIS — O1490 Unspecified pre-eclampsia, unspecified trimester: Secondary | ICD-10-CM | POA: Diagnosis present

## 2020-09-07 DIAGNOSIS — Z98891 History of uterine scar from previous surgery: Secondary | ICD-10-CM

## 2020-09-07 DIAGNOSIS — O283 Abnormal ultrasonic finding on antenatal screening of mother: Secondary | ICD-10-CM | POA: Insufficient documentation

## 2020-09-07 DIAGNOSIS — Z8616 Personal history of COVID-19: Secondary | ICD-10-CM | POA: Insufficient documentation

## 2020-09-07 DIAGNOSIS — O1414 Severe pre-eclampsia complicating childbirth: Secondary | ICD-10-CM | POA: Diagnosis present

## 2020-09-07 DIAGNOSIS — Z3A32 32 weeks gestation of pregnancy: Secondary | ICD-10-CM

## 2020-09-07 DIAGNOSIS — O1413 Severe pre-eclampsia, third trimester: Secondary | ICD-10-CM | POA: Diagnosis present

## 2020-09-07 DIAGNOSIS — Z6791 Unspecified blood type, Rh negative: Secondary | ICD-10-CM | POA: Diagnosis not present

## 2020-09-07 DIAGNOSIS — Z975 Presence of (intrauterine) contraceptive device: Secondary | ICD-10-CM

## 2020-09-07 DIAGNOSIS — Z3A33 33 weeks gestation of pregnancy: Secondary | ICD-10-CM

## 2020-09-07 LAB — CBC
HCT: 35.3 % — ABNORMAL LOW (ref 36.0–46.0)
Hemoglobin: 12.6 g/dL (ref 12.0–15.0)
MCH: 30.8 pg (ref 26.0–34.0)
MCHC: 35.7 g/dL (ref 30.0–36.0)
MCV: 86.3 fL (ref 80.0–100.0)
Platelets: 187 10*3/uL (ref 150–400)
RBC: 4.09 MIL/uL (ref 3.87–5.11)
RDW: 12.9 % (ref 11.5–15.5)
WBC: 10.4 10*3/uL (ref 4.0–10.5)
nRBC: 0 % (ref 0.0–0.2)

## 2020-09-07 LAB — COMPREHENSIVE METABOLIC PANEL
ALT: 12 U/L (ref 0–44)
AST: 20 U/L (ref 15–41)
Albumin: 3.1 g/dL — ABNORMAL LOW (ref 3.5–5.0)
Alkaline Phosphatase: 178 U/L — ABNORMAL HIGH (ref 38–126)
Anion gap: 9 (ref 5–15)
BUN: 15 mg/dL (ref 6–20)
CO2: 22 mmol/L (ref 22–32)
Calcium: 9.2 mg/dL (ref 8.9–10.3)
Chloride: 102 mmol/L (ref 98–111)
Creatinine, Ser: 0.8 mg/dL (ref 0.44–1.00)
GFR, Estimated: >60 mL/min (ref >60)
Glucose, Bld: 71 mg/dL (ref 70–99)
Potassium: 4 mmol/L (ref 3.5–5.1)
Sodium: 133 mmol/L — ABNORMAL LOW (ref 135–145)
Total Bilirubin: 0.6 mg/dL (ref 0.3–1.2)
Total Protein: 6 g/dL — ABNORMAL LOW (ref 6.5–8.1)

## 2020-09-07 LAB — PROTEIN / CREATININE RATIO, URINE
Creatinine, Urine: 67.9 mg/dL
Protein Creatinine Ratio: 0.1 mg/mg{Cre} (ref 0.00–0.15)
Total Protein, Urine: 7 mg/dL

## 2020-09-07 LAB — TYPE AND SCREEN
ABO/RH(D): A NEG
Antibody Screen: POSITIVE

## 2020-09-07 MED ORDER — HYDRALAZINE HCL 20 MG/ML IJ SOLN: 10.0000 mg | INTRAMUSCULAR | Status: DC | PRN

## 2020-09-07 MED ORDER — CALCIUM CARBONATE ANTACID 500 MG PO CHEW: 2.0000 | CHEWABLE_TABLET | ORAL | Status: DC | PRN

## 2020-09-07 MED ORDER — SODIUM CHLORIDE 0.9 % IV SOLN: 250.0000 mL | INTRAVENOUS | Status: DC | PRN

## 2020-09-07 MED ORDER — LABETALOL HCL 5 MG/ML IV SOLN
20.0000 mg | INTRAVENOUS | Status: DC | PRN
  Administered 2020-09-07 – 2020-09-08 (×3): 20 mg via INTRAVENOUS
  Filled 2020-09-07 (×3): qty 4

## 2020-09-07 MED ORDER — LABETALOL HCL 5 MG/ML IV SOLN
20.0000 mg | INTRAVENOUS | Status: DC | PRN
  Filled 2020-09-07: qty 4

## 2020-09-07 MED ORDER — DOCUSATE SODIUM 100 MG PO CAPS
100.0000 mg | ORAL_CAPSULE | Freq: Every day | ORAL | Status: DC
  Administered 2020-09-09: 100 mg via ORAL
  Filled 2020-09-07 (×2): qty 1

## 2020-09-07 MED ORDER — ACETAMINOPHEN 325 MG PO TABS
650.0000 mg | ORAL_TABLET | ORAL | Status: DC | PRN
  Administered 2020-09-09: 650 mg via ORAL
  Filled 2020-09-07: qty 2

## 2020-09-07 MED ORDER — ZOLPIDEM TARTRATE 5 MG PO TABS: 5.0000 mg | ORAL_TABLET | Freq: Every evening | ORAL | Status: DC | PRN

## 2020-09-07 MED ORDER — LABETALOL HCL 5 MG/ML IV SOLN: 80.0000 mg | INTRAVENOUS | Status: DC | PRN

## 2020-09-07 MED ORDER — PRENATAL MULTIVITAMIN CH
1.0000 | ORAL_TABLET | Freq: Every day | ORAL | Status: DC
  Administered 2020-09-07 – 2020-09-09 (×3): 1 via ORAL
  Filled 2020-09-07 (×3): qty 1

## 2020-09-07 MED ORDER — SODIUM CHLORIDE 0.9% FLUSH
3.0000 mL | Freq: Two times a day (BID) | INTRAVENOUS | Status: DC
  Administered 2020-09-07 – 2020-09-09 (×4): 3 mL via INTRAVENOUS

## 2020-09-07 MED ORDER — LABETALOL HCL 5 MG/ML IV SOLN
40.0000 mg | INTRAVENOUS | Status: DC | PRN
  Administered 2020-09-08 (×2): 40 mg via INTRAVENOUS
  Filled 2020-09-07 (×2): qty 8

## 2020-09-07 MED ORDER — HYDRALAZINE HCL 20 MG/ML IJ SOLN
10.0000 mg | INTRAMUSCULAR | Status: DC | PRN
  Administered 2020-09-08: 18:00:00 10 mg via INTRAVENOUS
  Filled 2020-09-07: qty 1

## 2020-09-07 MED ORDER — SODIUM CHLORIDE 0.9% FLUSH: 3.0000 mL | INTRAVENOUS | Status: DC | PRN

## 2020-09-07 MED ORDER — LABETALOL HCL 5 MG/ML IV SOLN
80.0000 mg | INTRAVENOUS | Status: DC | PRN
  Administered 2020-09-08: 18:00:00 80 mg via INTRAVENOUS
  Filled 2020-09-07 (×2): qty 16

## 2020-09-07 MED ORDER — LABETALOL HCL 5 MG/ML IV SOLN: 40.0000 mg | INTRAVENOUS | Status: DC | PRN

## 2020-09-07 NOTE — H&P (Signed)
[facility] ANTEPARTUM ADMISSION HISTORY AND PHYSICAL NOTE   History of Present Illness: Jill Arias is a 25 y.o. G3P2002 at [redacted]w[redacted]d admitted for preeclampsia. Patient was diagnosed with gestational hypertension on 2/3 when she was admitted to the hospital for preterm contractions. Was seen in MFM today for growth ultrasound & has severe range BPs. Severe range BPs continued in MAU. She denies history of chronic hypertension and denies any symptoms.  MFM ultrasound today shows EFW at the 5th percentile and increased dopplers. Fetal tracing was non reactive; BPP 8/10.  Patient reports the fetal movement as active. Patient reports uterine contraction  activity as none. Patient reports  vaginal bleeding as none. Patient describes fluid per vagina as None. Fetal presentation is cephalic. (per MFM ultrasound)  Patient Active Problem List   Diagnosis Date Noted  . Gestational hypertension 09/01/2020  . Preterm contractions 08/31/2020  . History of COVID-19 08/30/2020  . Marginal insertion of umbilical cord affecting management of mother 07/21/2020  . Supervision of other normal pregnancy, antepartum 04/27/2020  . Elevated BP without diagnosis of hypertension 06/03/2019  . Rh negative state in antepartum period 10/23/2017    Past Medical History:  Diagnosis Date  . Medical history non-contributory     Past Surgical History:  Procedure Laterality Date  . TONSILLECTOMY      OB History  Gravida Para Term Preterm AB Living  3 2 2  0 0 2  SAB IAB Ectopic Multiple Live Births  0 0 0 0 2    # Outcome Date GA Lbr Len/2nd Weight Sex Delivery Anes PTL Lv  3 Current           2 Term 12/09/17 [redacted]w[redacted]d 09:44 / 04:54 3610 g M Vag-Vacuum EPI  LIV  1 Term 2017     Vag-Spont       Social History   Socioeconomic History  . Marital status: Single    Spouse name: Not on file  . Number of children: Not on file  . Years of education: Not on file  . Highest education level: Not on file   Occupational History  . Not on file  Tobacco Use  . Smoking status: Never Smoker  . Smokeless tobacco: Never Used  Vaping Use  . Vaping Use: Never used  Substance and Sexual Activity  . Alcohol use: Never  . Drug use: Not Currently    Types: Marijuana  . Sexual activity: Yes    Birth control/protection: Pill  Other Topics Concern  . Not on file  Social History Narrative  . Not on file   Social Determinants of Health   Financial Resource Strain: Not on file  Food Insecurity: No Food Insecurity  . Worried About 2018 in the Last Year: Never true  . Ran Out of Food in the Last Year: Never true  Transportation Needs: No Transportation Needs  . Lack of Transportation (Medical): No  . Lack of Transportation (Non-Medical): No  Physical Activity: Not on file  Stress: Not on file  Social Connections: Not on file    Family History  Problem Relation Age of Onset  . Hypertension Mother     No Known Allergies  Medications Prior to Admission  Medication Sig Dispense Refill Last Dose  . Prenatal Vit-Fe Phos-FA-Omega (VITAFOL GUMMIES) 3.33-0.333-34.8 MG CHEW Chew 3 each by mouth daily. 90 tablet 11 09/06/2020 at Unknown time  . acetaminophen (TYLENOL) 500 MG tablet Take 500-1,000 mg by mouth every 6 (six) hours as needed for mild  pain or headache. (Patient not taking: Reported on 09/07/2020)     . Blood Pressure Monitoring DEVI 1 each by Does not apply route once a week. 1 each 0   . vitamin C (ASCORBIC ACID) 250 MG tablet Take 500 mg by mouth daily.       Review of Systems - History obtained from the patient General ROS: negative Respiratory ROS: negative Neurological ROS: negative  Vitals:  BP (!) 163/101   Pulse (!) 56   Temp 98.6 F (37 C) (Oral)   Resp 16   LMP 01/23/2020   SpO2 100%    Patient Vitals for the past 24 hrs:  BP Temp Temp src Pulse Resp SpO2  09/07/20 1446 (!) 163/101 -- -- (!) 56 -- --  09/07/20 1439 (!) 165/105 -- -- (!) 55 -- --   09/07/20 1435 -- -- -- -- -- 100 %  09/07/20 1430 -- -- -- -- -- 99 %  09/07/20 1425 (!) 154/107 98.6 F (37 C) Oral (!) 56 16 99 %  09/07/20 1420 -- -- -- -- -- 97 %  09/07/20 1415 -- -- -- -- -- 99 %    Physical Examination: CONSTITUTIONAL: Well-developed, well-nourished female in no acute distress.  HENT:  Normocephalic, atraumatic, External right and left ear normal. Oropharynx is clear and moist EYES: Conjunctivae and EOM are normal. Pupils are equal, round, and reactive to light. No scleral icterus.  NECK: Normal range of motion, supple, no masses SKIN: Skin is warm and dry. No rash noted. Not diaphoretic. No erythema. No pallor. NEUROLGIC: Alert and oriented to person, place, and time. Normal reflexes, muscle tone coordination. No cranial nerve deficit noted. PSYCHIATRIC: Normal mood and affect. Normal behavior. Normal judgment and thought content. CARDIOVASCULAR: Normal heart rate noted, regular rhythm RESPIRATORY: Effort and breath sounds normal, no problems with respiration noted ABDOMEN: Soft, nontender, nondistended, gravid. MUSCULOSKELETAL: Normal range of motion. No edema and no tenderness. 2+ distal pulses.  Cervix:  not evaluated Membranes:intact Fetal Monitoring:Baseline: 135 bpm, Variability: Good {> 6 bpm), Accelerations: none observed but monitoring intermittent and Decelerations: Absent Tocometer: Flat  Labs:  No results found for this or any previous visit (from the past 24 hour(s)).  Imaging Studies: [place] MFM FETAL BPP W/NONSTRESS  Result Date: 09/07/2020 ----------------------------------------------------------------------  OBSTETRICS REPORT                       (Signed Final 09/07/2020 02:09 pm) ---------------------------------------------------------------------- Patient Info  ID #:       [id number]                          D.O.B.:  26-Mar-1996 (24 yrs)  Name:       Jill Arias                Visit Date: 09/07/2020 11:13 am  ---------------------------------------------------------------------- Performed By  Attending:        Noralee Space MD        Ref. Address:     Faculty Practice  Performed By:     Sandi Mealy        [facility]  Referred By:      Currie Paris NP ---------------------------------------------------------------------- Orders  #  Description                           Code        Ordered By  1  [place] MFM OB FOLLOW UP                   76816.01    YU FANG  2  [place] MFM FETAL BPP                      16109.6     RAVI SHANKAR     W/NONSTRESS  3  [place] MFM UA CORD DOPPLER                76820.02    RAVI [facility] ----------------------------------------------------------------------  #  Order #                     Accession #                Episode #  1  [id number]                   [id number]                 [id number]  2  [id number]                   [id number]                 [id number]  3  [id number]                   [id number]                 [id number] ---------------------------------------------------------------------- Indications  Maternal care for known or suspected poor      O36.5931  fetal growth, third trimester, fetus 1 IUGR  Marginal insertion of umbilical cord affecting O43.191  management of mother in first trimester  Encounter for antenatal screening,             Z36.9  unspecified  [redacted] weeks gestation of pregnancy                Z3A.32 ---------------------------------------------------------------------- Fetal Evaluation  Num Of Fetuses:         1  Fetal Heart Rate(bpm):  145  Cardiac Activity:       Observed  Presentation:           Cephalic  Placenta:  Fundal  P. Cord Insertion:      Marginal insertion  Amniotic Fluid  AFI FV:      Within  normal limits  AFI Sum(cm)     %Tile       Largest Pocket(cm)  10.65           22          3.87  RUQ(cm)       RLQ(cm)       LUQ(cm)        LLQ(cm)  3.36          0             3.42           3.87 ---------------------------------------------------------------------- Biophysical Evaluation  Amniotic F.V:   Pocket => 2 cm             F. Tone:        Observed  F. Movement:    Observed                   N.S.T:          Nonreactive  F. Breathing:   Observed                   Score:          8/10 ---------------------------------------------------------------------- Biometry  BPD:      73.8  mm     G. Age:  29w 4d        < 1  %    CI:         71.2   %    70 - 86                                                          FL/HC:      21.2   %    19.9 - 21.5  HC:      278.6  mm     G. Age:  30w 3d        < 1  %    HC/AC:      1.04        0.96 - 1.11  AC:      267.9  mm     G. Age:  30w 6d          9  %    FL/BPD:     80.2   %    71 - 87  FL:       59.2  mm     G. Age:  30w 6d          6  %    FL/AC:      22.1   %    20 - 24  HUM:      52.3  mm     G. Age:  30w 3d         13  %  Est. FW:    1630  gm      3 lb 9 oz    4.6  % ---------------------------------------------------------------------- OB History  Gravidity:    3         Term:   2        Prem:   0  SAB:   0  TOP:          0       Ectopic:  0        Living: 2 ---------------------------------------------------------------------- Gestational Age  LMP:           32w 4d        Date:  01/23/20                 EDD:   10/29/20  U/S Today:     30w 3d                                        EDD:   11/13/20  Best:          32w 4d     Det. By:  LMP  (01/23/20)          EDD:   10/29/20 ---------------------------------------------------------------------- Anatomy  Cranium:               Previously seen        Aortic Arch:            Previously seen  Cavum:                 Previously seen        Ductal Arch:            Previously seen  Ventricles:            Appears normal          Diaphragm:              Appears normal  Choroid Plexus:        Previously seen        Stomach:                Appears normal, left                                                                        sided  Cerebellum:            Previously seen        Abdomen:                Previously seen  Posterior Fossa:       Previously seen        Abdominal Wall:         Previously seen  Nuchal Fold:           Not applicable (>20    Cord Vessels:           Previously seen                         wks GA)  Face:                  Orbits and profile     Kidneys:                Appear normal  previously seen  Lips:                  Previously seen        Bladder:                Appears normal  Thoracic:              Appears normal         Spine:                  Previously seen  Heart:                 Appears normal         Upper Extremities:      Previously seen                         (4CH, axis, and                         situs)  RVOT:                  Previously seen        Lower Extremities:      Previously seen  LVOT:                  Previously seen ---------------------------------------------------------------------- Doppler - Fetal Vessels  Umbilical Artery   S/D     %tile      RI    %tile      PI    %tile            ADFV    RDFV   5.28   > 97.5    0.81   > 97.5    1.43   > 97.5               No      No ---------------------------------------------------------------------- Impression  Marginal cord insertion.  Patient returned for fetal growth  assessment.  Blood pressures today at our office were  148/99 and repeat 133/85 mmHg.  Patient does not have  symptoms of severe features of preeclampsia.  On today's ultrasound, the estimated fetal weight is at the 5th  percentile.  AC is at the 9th percentile and head  circumference measurement is at between -2 and -1 SD  (normal).  Amniotic fluid is normal and good fetal activity  seen.  Cephalic presentation.  Clinical artery Doppler showed   increased S/D ratio.  NST is not reactive. BPP 8/10.  I explained fetal growth restriction.  Most likely cause is  placental insufficiency.  After NST, she had BP readings of 179/118 and 181/117 mm  Hg. Patient does not have symptoms. I explained the  possibility of preeclampsia and recommended that she be  evaluated at the MAU. Patient agreed to go to the MAU.  MAU team was informed. ---------------------------------------------------------------------- Recommendations  Continue weekly BPP, NST and UA Doppler studies till  delivery if discharged from the MAU. ----------------------------------------------------------------------                  Noralee Space, MD Electronically Signed Final Report   09/07/2020 02:09 pm ----------------------------------------------------------------------  [place] MFM OB FOLLOW UP  Result Date: 09/07/2020 ----------------------------------------------------------------------  OBSTETRICS REPORT                       (Signed Final 09/07/2020 02:09 pm) ---------------------------------------------------------------------- Patient Info  ID #:  [id number]                          D.O.B.:  1995/11/04 (24 yrs)  Name:       Jill Arias                Visit Date: 09/07/2020 11:13 am ---------------------------------------------------------------------- Performed By  Attending:        Noralee Space MD        Ref. Address:     Faculty Practice  Performed By:     Sandi Mealy        Location[facility]                                     [facility]  Referred By:      Currie Paris NP ---------------------------------------------------------------------- Orders  #  Description                           Code        Ordered By  1  [place] MFM OB FOLLOW UP                   76816.01    YU FANG   2  [place] MFM FETAL BPP                      01027.2     RAVI SHANKAR     W/NONSTRESS  3  [place] MFM UA CORD DOPPLER                53664.40    RAVI [facility] ----------------------------------------------------------------------  #  Order #                     Accession #                Episode #  1  [id number]                   [id number]                 [id number]  2  [id number]                   [id number]  [id number]  3  [id number]                   [id number]                 [id number] ---------------------------------------------------------------------- Indications  Maternal care for known or suspected poor      O36.5931  fetal growth, third trimester, fetus 1 IUGR  Marginal insertion of umbilical cord affecting O43.191  management of mother in first trimester  Encounter for antenatal screening,             Z36.9  unspecified  [redacted] weeks gestation of pregnancy                Z3A.32 ---------------------------------------------------------------------- Fetal Evaluation  Num Of Fetuses:         1  Fetal Heart Rate(bpm):  145  Cardiac Activity:       Observed  Presentation:           Cephalic  Placenta:               Fundal  P. Cord Insertion:      Marginal insertion  Amniotic Fluid  AFI FV:      Within normal limits  AFI Sum(cm)     %Tile       Largest Pocket(cm)  10.65           22          3.87  RUQ(cm)       RLQ(cm)       LUQ(cm)        LLQ(cm)  3.36          0             3.42           3.87 ---------------------------------------------------------------------- Biophysical Evaluation  Amniotic F.V:   Pocket => 2 cm             F. Tone:        Observed  F. Movement:    Observed                   N.S.T:          Nonreactive  F. Breathing:   Observed                   Score:          8/10 ---------------------------------------------------------------------- Biometry  BPD:      73.8  mm     G. Age:  29w 4d        < 1  %    CI:         71.2   %    70 - 86                                                           FL/HC:      21.2   %    19.9 - 21.5  HC:      278.6  mm     G. Age:  30w 3d        < 1  %    HC/AC:      1.04        0.96 - 1.11  AC:      267.9  mm     G. Age:  30w 6d          9  %    FL/BPD:     80.2   %    71 - 87  FL:       59.2  mm     G. Age:  30w 6d          6  %    FL/AC:      22.1   %    20 - 24  HUM:      52.3  mm     G. Age:  30w 3d         13  %  Est. FW:    1630  gm      3 lb 9 oz    4.6  % ---------------------------------------------------------------------- OB History  Gravidity:    3         Term:   2        Prem:   0        SAB:   0  TOP:          0       Ectopic:  0        Living: 2 ---------------------------------------------------------------------- Gestational Age  LMP:           32w 4d        Date:  01/23/20                 EDD:   10/29/20  U/S Today:     30w 3d                                        EDD:   11/13/20  Best:          32w 4d     Det. By:  LMP  (01/23/20)          EDD:   10/29/20 ---------------------------------------------------------------------- Anatomy  Cranium:               Previously seen        Aortic Arch:            Previously seen  Cavum:                 Previously seen        Ductal Arch:            Previously seen  Ventricles:            Appears normal         Diaphragm:              Appears normal  Choroid Plexus:        Previously seen        Stomach:                Appears normal, left                                                                        sided  Cerebellum:            Previously  seen        Abdomen:                Previously seen  Posterior Fossa:       Previously seen        Abdominal Wall:         Previously seen  Nuchal Fold:           Not applicable (>20    Cord Vessels:           Previously seen                         wks GA)  Face:                  Orbits and profile     Kidneys:                Appear normal                         previously seen  Lips:                  Previously seen        Bladder:                Appears normal   Thoracic:              Appears normal         Spine:                  Previously seen  Heart:                 Appears normal         Upper Extremities:      Previously seen                         (4CH, axis, and                         situs)  RVOT:                  Previously seen        Lower Extremities:      Previously seen  LVOT:                  Previously seen ---------------------------------------------------------------------- Doppler - Fetal Vessels  Umbilical Artery   S/D     %tile      RI    %tile      PI    %tile            ADFV    RDFV   5.28   > 97.5    0.81   > 97.5    1.43   > 97.5               No      No ---------------------------------------------------------------------- Impression  Marginal cord insertion.  Patient returned for fetal growth  assessment.  Blood pressures today at our office were  148/99 and repeat 133/85 mmHg.  Patient does not have  symptoms of severe features of preeclampsia.  On today's ultrasound, the estimated fetal weight is at the 5th  percentile.  AC is at the 9th percentile and head  circumference measurement is at between -2 and -1 SD  (normal).  Amniotic fluid is normal  and good fetal activity  seen.  Cephalic presentation.  Clinical artery Doppler showed  increased S/D ratio.  NST is not reactive. BPP 8/10.  I explained fetal growth restriction.  Most likely cause is  placental insufficiency.  After NST, she had BP readings of 179/118 and 181/117 mm  Hg. Patient does not have symptoms. I explained the  possibility of preeclampsia and recommended that she be  evaluated at the MAU. Patient agreed to go to the MAU.  MAU team was informed. ---------------------------------------------------------------------- Recommendations  Continue weekly BPP, NST and UA Doppler studies till  delivery if discharged from the MAU. ----------------------------------------------------------------------                  Noralee Space, MD Electronically Signed Final Report   09/07/2020  02:09 pm ----------------------------------------------------------------------  [place] MFM UA CORD DOPPLER  Result Date: 09/07/2020 ----------------------------------------------------------------------  OBSTETRICS REPORT                       (Signed Final 09/07/2020 02:09 pm) ---------------------------------------------------------------------- Patient Info  ID #:       [id number]                          D.O.B.:  July 13, 1996 (24 yrs)  Name:       Jill Arias                Visit Date: 09/07/2020 11:13 am ---------------------------------------------------------------------- Performed By  Attending:        Noralee Space MD        Ref. Address:     Faculty Practice  Performed By:     Sandi Mealy        [facility]                                     [facility]  Referred By:      Currie Paris NP ---------------------------------------------------------------------- Orders  #  Description                           Code        Ordered By  1  [place] MFM OB FOLLOW UP                   40981.19    YU FANG  2  [place] MFM FETAL BPP                      [id number]     RAVI SHANKAR     W/NONSTRESS  3  [place] MFM UA CORD DOPPLER                16109.60    [facility] ----------------------------------------------------------------------  #  Order #                     Accession #                Episode #  1  [id number]                   [id number]                 [id number]  2  [id number]                   [id number]                 [id number]  3  [id number]                   [id number]                 [id number] ---------------------------------------------------------------------- Indications  Maternal care for known or suspected poor      O36.5931  fetal growth, third trimester, fetus 1 IUGR  Marginal insertion of umbilical  cord affecting O43.191  management of mother in first trimester  Encounter for antenatal screening,             Z36.9  unspecified  [redacted] weeks gestation of pregnancy                Z3A.32 ---------------------------------------------------------------------- Fetal Evaluation  Num Of Fetuses:         1  Fetal Heart Rate(bpm):  145  Cardiac Activity:       Observed  Presentation:           Cephalic  Placenta:               Fundal  P. Cord Insertion:      Marginal insertion  Amniotic Fluid  AFI FV:      Within normal limits  AFI Sum(cm)     %Tile       Largest Pocket(cm)  10.65           22          3.87  RUQ(cm)       RLQ(cm)       LUQ(cm)        LLQ(cm)  3.36          0             3.42           3.87 ---------------------------------------------------------------------- Biophysical Evaluation  Amniotic F.V:   Pocket => 2 cm             F. Tone:        Observed  F. Movement:    Observed                   N.S.T:          Nonreactive  F. Breathing:   Observed                   Score:  8/10 ---------------------------------------------------------------------- Biometry  BPD:      73.8  mm     G. Age:  29w 4d        < 1  %    CI:         71.2   %    70 - 86                                                          FL/HC:      21.2   %    19.9 - 21.5  HC:      278.6  mm     G. Age:  30w 3d        < 1  %    HC/AC:      1.04        0.96 - 1.11  AC:      267.9  mm     G. Age:  30w 6d          9  %    FL/BPD:     80.2   %    71 - 87  FL:       59.2  mm     G. Age:  30w 6d          6  %    FL/AC:      22.1   %    20 - 24  HUM:      52.3  mm     G. Age:  30w 3d         13  %  Est. FW:    1630  gm      3 lb 9 oz    4.6  % ---------------------------------------------------------------------- OB History  Gravidity:    3         Term:   2        Prem:   0        SAB:   0  TOP:          0       Ectopic:  0        Living: 2 ---------------------------------------------------------------------- Gestational Age  LMP:            32w 4d        Date:  01/23/20                 EDD:   10/29/20  U/S Today:     30w 3d                                        EDD:   11/13/20  Best:          32w 4d     Det. By:  LMP  (01/23/20)          EDD:   10/29/20 ---------------------------------------------------------------------- Anatomy  Cranium:               Previously seen        Aortic Arch:            Previously seen  Cavum:  Previously seen        Ductal Arch:            Previously seen  Ventricles:            Appears normal         Diaphragm:              Appears normal  Choroid Plexus:        Previously seen        Stomach:                Appears normal, left                                                                        sided  Cerebellum:            Previously seen        Abdomen:                Previously seen  Posterior Fossa:       Previously seen        Abdominal Wall:         Previously seen  Nuchal Fold:           Not applicable (>20    Cord Vessels:           Previously seen                         wks GA)  Face:                  Orbits and profile     Kidneys:                Appear normal                         previously seen  Lips:                  Previously seen        Bladder:                Appears normal  Thoracic:              Appears normal         Spine:                  Previously seen  Heart:                 Appears normal         Upper Extremities:      Previously seen                         (4CH, axis, and                         situs)  RVOT:                  Previously seen        Lower Extremities:      Previously seen  LVOT:  Previously seen ---------------------------------------------------------------------- Doppler - Fetal Vessels  Umbilical Artery   S/D     %tile      RI    %tile      PI    %tile            ADFV    RDFV   5.28   > 97.5    0.81   > 97.5    1.43   > 97.5               No      No ---------------------------------------------------------------------- Impression   Marginal cord insertion.  Patient returned for fetal growth  assessment.  Blood pressures today at our office were  148/99 and repeat 133/85 mmHg.  Patient does not have  symptoms of severe features of preeclampsia.  On today's ultrasound, the estimated fetal weight is at the 5th  percentile.  AC is at the 9th percentile and head  circumference measurement is at between -2 and -1 SD  (normal).  Amniotic fluid is normal and good fetal activity  seen.  Cephalic presentation.  Clinical artery Doppler showed  increased S/D ratio.  NST is not reactive. BPP 8/10.  I explained fetal growth restriction.  Most likely cause is  placental insufficiency.  After NST, she had BP readings of 179/118 and 181/117 mm  Hg. Patient does not have symptoms. I explained the  possibility of preeclampsia and recommended that she be  evaluated at the MAU. Patient agreed to go to the MAU.  MAU team was informed. ---------------------------------------------------------------------- Recommendations  Continue weekly BPP, NST and UA Doppler studies till  delivery if discharged from the MAU. ----------------------------------------------------------------------                  Noralee Space, MD Electronically Signed Final Report   09/07/2020 02:09 pm ----------------------------------------------------------------------    Assessment and Plan: 1. Preeclampsia, severe, third trimester   2. Marginal insertion of umbilical cord affecting management of mother   3. History of COVID-19   4. Poor fetal growth affecting management of mother in third trimester, single or unspecified fetus   5. [redacted] weeks gestation of pregnancy     Judeth Horn, NP 09/07/2020 2:57 PM

## 2020-09-07 NOTE — Consult Note (Signed)
[facility]  [facility] 09/07/2020    9:34 PM  Neonatal Medicine Consultation         Jill Arias          MRN:  [id number]  I was called at the request of the patient's obstetrician (Dr. Shawnie Pons) to speak to this patient due to potential premature delivery at 32+ weeks.    The patient's prenatal course includes preterm labor, gestational hypertension, preeclampsia (severe), marginal cord insertion, IUGR, earlier COVID-19.  She was admitted on 08/31/20 for PTL, and was given magnesium sulfate and a course of betamethasone.  She was discharged on 09/02/20, but readmitted today for severe preeclampsia.  She is [redacted]w[redacted]d currently.  She is admitted to [facility], and is receiving treatment that includes monitoring and BP measurements, with labetalol and hydralazine to be given as needed.  The baby is a female.  I reviewed expectations for a baby born at 32 weeks and beyond, including survival, length of stay, issues such as respiratory distress, infection, feeding.  I described how we provide respiratory and feeding support.  Mom plans to breast feed, which I encouraged as best for the baby.   I spent 20 minutes reviewing the record, speaking to the patient, and entering appropriate documentation.  More than 50% of the time was spent face to face with patient.  Patient Active Problem List   Diagnosis Date Noted  . Pre-eclampsia 09/07/2020  . Gestational hypertension 09/01/2020  . Preterm contractions 08/31/2020  . History of COVID-19 08/30/2020  . Marginal insertion of umbilical cord affecting management of mother 07/21/2020  . Supervision of other normal pregnancy, antepartum 04/27/2020  . Elevated BP without diagnosis of hypertension 06/03/2019  . Rh negative state in antepartum period 10/23/2017    _____________________ Angelita Ingles, MD Attending Neonatologist

## 2020-09-07 NOTE — Progress Notes (Signed)
Patient with 2 elevated BP's 15 min apart, Dr. Judeth Cornfield, MFM aware, pt sent to MAU for further monitoring.

## 2020-09-07 NOTE — Procedures (Signed)
Jill Arias Mar 28, 1996 [redacted]w[redacted]d  Fetus A Non-Stress Test Interpretation for 09/07/20  Indication: increased dopplers  Fetal Heart Rate A Mode: External Baseline Rate (A): 140 bpm Variability: Minimal,Moderate Accelerations: None Decelerations: None Multiple birth?: No  Uterine Activity Mode: Palpation,Toco Contraction Frequency (min): 2-5 Contraction Duration (sec): 40-60 Contraction Quality: Mild Resting Tone Palpated: Relaxed Resting Time: Adequate  Interpretation (Fetal Testing) Nonstress Test Interpretation: Non-reactive Overall Impression: Non-reassuring Comments: Dr. Judeth Cornfield reviewed tracing.

## 2020-09-08 DIAGNOSIS — Z3A32 32 weeks gestation of pregnancy: Secondary | ICD-10-CM

## 2020-09-08 DIAGNOSIS — O1413 Severe pre-eclampsia, third trimester: Secondary | ICD-10-CM

## 2020-09-08 MED ORDER — LABETALOL HCL 200 MG PO TABS
200.0000 mg | ORAL_TABLET | Freq: Three times a day (TID) | ORAL | Status: DC
  Administered 2020-09-08 (×2): 200 mg via ORAL
  Filled 2020-09-08 (×2): qty 1

## 2020-09-08 MED ORDER — LABETALOL HCL 200 MG PO TABS
400.0000 mg | ORAL_TABLET | Freq: Three times a day (TID) | ORAL | Status: DC
  Administered 2020-09-08 – 2020-09-09 (×3): 400 mg via ORAL
  Filled 2020-09-08 (×4): qty 2

## 2020-09-08 NOTE — Progress Notes (Signed)
Patient ID: Jill Arias, female   DOB: Aug 31, 1995, 25 y.o.   MRN: [id number] FACULTY PRACTICE ANTEPARTUM(COMPREHENSIVE) NOTE  Jill Arias is a 25 y.o. G3P2002 at [redacted]w[redacted]d by best clinical estimate who is admitted for preeclampsia with severe features.   Fetal presentation is cephalic. Length of Stay:  1  Days  ASSESSMENT: Active Problems:   Pre-eclampsia   PLAN: Continue inpatient management Labetalol 200 3 times daily begun for blood pressure control. Status post betamethasone last week for preterm labor  Fetal monitoring Status post NICU consult   Subjective: Feels well, she is without complaint. Patient reports the fetal movement as active. Patient reports uterine contraction  activity as none. Patient reports  vaginal bleeding as none. Patient describes fluid per vagina as None.  Vitals:  Blood pressure (!) 142/86, pulse 68, temperature 98.2 F (36.8 C), temperature source Oral, resp. rate 17, height 5\' 8"  (1.727 m), weight 62.5 kg, last menstrual period 01/23/2020, SpO2 99 %. Physical Examination:  General appearance - alert, well appearing, and in no distress Chest - normal effort Abdomen - gravid, non-tender Fundal Height:  size less than dates Extremities: Homans sign is negative, no sign of DVT  Membranes:intact  Fetal Monitoring:  Baseline: 135 bpm, Variability: Good {> 6 bpm), Accelerations: Reactive and Decelerations: Absent  Labs:  Results for orders placed or performed during the hospital encounter of 09/07/20 (from the past 24 hour(s))  CBC   Collection Time: 09/07/20  2:23 PM  Result Value Ref Range   WBC 10.4 4.0 - 10.5 K/uL   RBC 4.09 3.87 - 5.11 MIL/uL   Hemoglobin 12.6 12.0 - 15.0 g/dL   HCT 11/05/20 (L) 24.2 - 35.3 %   MCV 86.3 80.0 - 100.0 fL   MCH 30.8 26.0 - 34.0 pg   MCHC 35.7 30.0 - 36.0 g/dL   RDW 61.4 43.1 - 54.0 %   Platelets 187 150 - 400 K/uL   nRBC 0.0 0.0 - 0.2 %  Comprehensive metabolic panel   Collection Time: 09/07/20  2:23  PM  Result Value Ref Range   Sodium 133 (L) 135 - 145 mmol/L   Potassium 4.0 3.5 - 5.1 mmol/L   Chloride 102 98 - 111 mmol/L   CO2 22 22 - 32 mmol/L   Glucose, Bld 71 70 - 99 mg/dL   BUN 15 6 - 20 mg/dL   Creatinine, Ser 11/05/20 0.44 - 1.00 mg/dL   Calcium 9.2 8.9 - 7.61 mg/dL   Total Protein 6.0 (L) 6.5 - 8.1 g/dL   Albumin 3.1 (L) 3.5 - 5.0 g/dL   AST 20 15 - 41 U/L   ALT 12 0 - 44 U/L   Alkaline Phosphatase 178 (H) 38 - 126 U/L   Total Bilirubin 0.6 0.3 - 1.2 mg/dL   GFR, Estimated 95.0 >93 mL/min   Anion gap 9 5 - 15  Type and screen [facility]   Collection Time: 09/07/20  3:05 PM  Result Value Ref Range   ABO/RH(D) A NEG    Antibody Screen POS    Sample Expiration 09/10/2020,2359    Antibody Identification      PASSIVELY ACQUIRED ANTI-D Performed at [facility], [address], [place], [place]   Protein / creatinine ratio, urine   Collection Time: 09/07/20  3:10 PM  Result Value Ref Range   Creatinine, Urine 67.90 mg/dL   Total Protein, Urine 7 mg/dL   Protein Creatinine Ratio 0.10 0.00 - 0.15 mg/mg[Cre]  Imaging Studies:    Impression  Marginal cord insertion.  Patient returned for fetal growth  assessment.  Blood pressures today at our office were  148/99 and repeat 133/85 mmHg.  Patient does not have  symptoms of severe features of preeclampsia.  On today's ultrasound, the estimated fetal weight is at the 5th percentile.  AC is at the 9th percentile and head  circumference measurement is at between -2 and -1 SD  (normal).  Amniotic fluid is normal and good fetal activity  seen.  Cephalic presentation.  Clinical artery Doppler showed increased S/D ratio.  NST is not reactive. BPP 8/10.  I explained fetal growth restriction.  Most likely cause is  placental insufficiency.  After NST, she had BP readings of 179/118 and 181/117 mm Hg. Patient does not have symptoms. I explained the  possibility of preeclampsia and recommended that  she be  evaluated at the MAU. Patient agreed to go to the MAU.  MAU team was informed. ---------------------------------------------------------------------- Recommendations  Continue weekly BPP, NST and UA Doppler studies till  delivery if discharged from the MAU.  Medications:  Scheduled . docusate sodium  100 mg Oral Daily  . labetalol  200 mg Oral TID  . prenatal multivitamin  1 tablet Oral Q1200  . sodium chloride flush  3 mL Intravenous Q12H   I have reviewed the patient's current medications.   Reva Bores, MD 09/08/2020,12:43 PM

## 2020-09-09 ENCOUNTER — Inpatient Hospital Stay (HOSPITAL_COMMUNITY): Payer: Medicaid Other | Admitting: Anesthesiology

## 2020-09-09 ENCOUNTER — Encounter (HOSPITAL_COMMUNITY): Payer: Self-pay | Admitting: Family Medicine

## 2020-09-09 ENCOUNTER — Encounter (HOSPITAL_COMMUNITY)
Admission: AD | Disposition: A | Discharge status: Home / Self Care | Payer: Self-pay | Source: Home / Self Care | Attending: Family Medicine

## 2020-09-09 DIAGNOSIS — Z3A32 32 weeks gestation of pregnancy: Secondary | ICD-10-CM

## 2020-09-09 DIAGNOSIS — O43193 Other malformation of placenta, third trimester: Secondary | ICD-10-CM

## 2020-09-09 DIAGNOSIS — O1414 Severe pre-eclampsia complicating childbirth: Secondary | ICD-10-CM

## 2020-09-09 DIAGNOSIS — O365930 Maternal care for other known or suspected poor fetal growth, third trimester, not applicable or unspecified: Secondary | ICD-10-CM

## 2020-09-09 LAB — CBC
HCT: 33 % — ABNORMAL LOW (ref 36.0–46.0)
Hemoglobin: 11.1 g/dL — ABNORMAL LOW (ref 12.0–15.0)
MCH: 29.6 pg (ref 26.0–34.0)
MCHC: 33.6 g/dL (ref 30.0–36.0)
MCV: 88 fL (ref 80.0–100.0)
Platelets: 158 10*3/uL (ref 150–400)
RBC: 3.75 MIL/uL — ABNORMAL LOW (ref 3.87–5.11)
RDW: 13.2 % (ref 11.5–15.5)
WBC: 9.6 10*3/uL (ref 4.0–10.5)
nRBC: 0 % (ref 0.0–0.2)

## 2020-09-09 LAB — COMPREHENSIVE METABOLIC PANEL
ALT: 11 U/L (ref 0–44)
AST: 21 U/L (ref 15–41)
Albumin: 2.7 g/dL — ABNORMAL LOW (ref 3.5–5.0)
Alkaline Phosphatase: 161 U/L — ABNORMAL HIGH (ref 38–126)
Anion gap: 9 (ref 5–15)
BUN: 18 mg/dL (ref 6–20)
CO2: 21 mmol/L — ABNORMAL LOW (ref 22–32)
Calcium: 8.4 mg/dL — ABNORMAL LOW (ref 8.9–10.3)
Chloride: 105 mmol/L (ref 98–111)
Creatinine, Ser: 1.01 mg/dL — ABNORMAL HIGH (ref 0.44–1.00)
GFR, Estimated: >60 mL/min (ref >60)
Glucose, Bld: 85 mg/dL (ref 70–99)
Potassium: 4.1 mmol/L (ref 3.5–5.1)
Sodium: 135 mmol/L (ref 135–145)
Total Bilirubin: 0.6 mg/dL (ref 0.3–1.2)
Total Protein: 5.4 g/dL — ABNORMAL LOW (ref 6.5–8.1)

## 2020-09-09 SURGERY — Surgical Case
Anesthesia: Spinal

## 2020-09-09 MED ORDER — MAGNESIUM SULFATE BOLUS VIA INFUSION
4.0000 g | Freq: Once | INTRAVENOUS | Status: AC
  Administered 2020-09-09: 4 g via INTRAVENOUS
  Filled 2020-09-09: qty 1000

## 2020-09-09 MED ORDER — LACTATED RINGERS IV BOLUS
500.0000 mL | Freq: Once | INTRAVENOUS | Status: AC
  Administered 2020-09-09: 500 mL via INTRAVENOUS

## 2020-09-09 MED ORDER — SODIUM CHLORIDE 0.9 % IV SOLN: INTRAVENOUS | Status: DC | PRN

## 2020-09-09 MED ORDER — OXYCODONE HCL 5 MG/5ML PO SOLN: 5.0000 mg | Freq: Once | ORAL | Status: DC | PRN

## 2020-09-09 MED ORDER — OXYCODONE HCL 5 MG PO TABS: 5.0000 mg | ORAL_TABLET | Freq: Once | ORAL | Status: DC | PRN

## 2020-09-09 MED ORDER — SOD CITRATE-CITRIC ACID 500-334 MG/5ML PO SOLN
30.0000 mL | ORAL | Status: AC
  Administered 2020-09-09: 30 mL via ORAL
  Filled 2020-09-09: qty 15

## 2020-09-09 MED ORDER — NIFEDIPINE ER OSMOTIC RELEASE 30 MG PO TB24
30.0000 mg | ORAL_TABLET | Freq: Two times a day (BID) | ORAL | Status: DC
  Administered 2020-09-09 (×2): 30 mg via ORAL
  Filled 2020-09-09 (×2): qty 1

## 2020-09-09 MED ORDER — CEFAZOLIN SODIUM-DEXTROSE 2-4 GM/100ML-% IV SOLN
INTRAVENOUS | Status: AC
  Filled 2020-09-09: qty 100

## 2020-09-09 MED ORDER — MORPHINE SULFATE (PF) 0.5 MG/ML IJ SOLN
INTRAMUSCULAR | Status: AC
  Filled 2020-09-09: qty 10

## 2020-09-09 MED ORDER — BUPIVACAINE IN DEXTROSE 0.75-8.25 % IT SOLN
INTRATHECAL | Status: DC | PRN
  Administered 2020-09-09: 12 mg via INTRATHECAL

## 2020-09-09 MED ORDER — NALBUPHINE HCL 10 MG/ML IJ SOLN: 5.0000 mg | Freq: Once | INTRAMUSCULAR | Status: DC | PRN

## 2020-09-09 MED ORDER — NALBUPHINE HCL 10 MG/ML IJ SOLN: 5.0000 mg | INTRAMUSCULAR | Status: DC | PRN

## 2020-09-09 MED ORDER — ONDANSETRON HCL 4 MG/2ML IJ SOLN
INTRAMUSCULAR | Status: AC
  Filled 2020-09-09: qty 2

## 2020-09-09 MED ORDER — DEXTROSE 5 % IV SOLN
1.0000 ug/kg/h | INTRAVENOUS | Status: DC | PRN
  Filled 2020-09-09: qty 5

## 2020-09-09 MED ORDER — LACTATED RINGERS IV SOLN: INTRAVENOUS | Status: DC

## 2020-09-09 MED ORDER — FENTANYL CITRATE (PF) 100 MCG/2ML IJ SOLN
INTRAMUSCULAR | Status: DC | PRN
  Administered 2020-09-09: 15 ug via INTRATHECAL

## 2020-09-09 MED ORDER — LABETALOL HCL 5 MG/ML IV SOLN: 20.0000 mg | INTRAVENOUS | Status: DC | PRN

## 2020-09-09 MED ORDER — SCOPOLAMINE 1 MG/3DAYS TD PT72
1.0000 | MEDICATED_PATCH | Freq: Once | TRANSDERMAL | Status: DC
  Administered 2020-09-10: 1.5 mg via TRANSDERMAL

## 2020-09-09 MED ORDER — STERILE WATER FOR IRRIGATION IR SOLN
Status: DC | PRN
  Administered 2020-09-09: 1000 mL

## 2020-09-09 MED ORDER — SODIUM CHLORIDE 0.9% FLUSH: 3.0000 mL | INTRAVENOUS | Status: DC | PRN

## 2020-09-09 MED ORDER — KETOROLAC TROMETHAMINE 30 MG/ML IJ SOLN: 30.0000 mg | Freq: Once | INTRAMUSCULAR | Status: AC | PRN

## 2020-09-09 MED ORDER — MORPHINE SULFATE (PF) 0.5 MG/ML IJ SOLN
INTRAMUSCULAR | Status: DC | PRN
  Administered 2020-09-09: 150 ug via INTRATHECAL

## 2020-09-09 MED ORDER — HYDROMORPHONE HCL 1 MG/ML IJ SOLN: 0.2500 mg | INTRAMUSCULAR | Status: DC | PRN

## 2020-09-09 MED ORDER — PHENYLEPHRINE HCL-NACL 20-0.9 MG/250ML-% IV SOLN
INTRAVENOUS | Status: DC | PRN
  Administered 2020-09-09: 60 ug/min via INTRAVENOUS

## 2020-09-09 MED ORDER — DEXAMETHASONE SODIUM PHOSPHATE 4 MG/ML IJ SOLN
INTRAMUSCULAR | Status: AC
  Filled 2020-09-09: qty 1

## 2020-09-09 MED ORDER — CEFAZOLIN SODIUM-DEXTROSE 2-3 GM-%(50ML) IV SOLR
INTRAVENOUS | Status: DC | PRN
  Administered 2020-09-09: 2 g via INTRAVENOUS

## 2020-09-09 MED ORDER — PHENYLEPHRINE HCL-NACL 20-0.9 MG/250ML-% IV SOLN
INTRAVENOUS | Status: AC
  Filled 2020-09-09: qty 250

## 2020-09-09 MED ORDER — CEFAZOLIN SODIUM-DEXTROSE 2-4 GM/100ML-% IV SOLN
2.0000 g | INTRAVENOUS | Status: DC
  Filled 2020-09-09: qty 100

## 2020-09-09 MED ORDER — LABETALOL HCL 5 MG/ML IV SOLN: 40.0000 mg | INTRAVENOUS | Status: DC | PRN

## 2020-09-09 MED ORDER — OXYTOCIN-SODIUM CHLORIDE 30-0.9 UT/500ML-% IV SOLN
INTRAVENOUS | Status: DC | PRN
  Administered 2020-09-09: 30 mL via INTRAVENOUS

## 2020-09-09 MED ORDER — ONDANSETRON HCL 4 MG/2ML IJ SOLN
INTRAMUSCULAR | Status: DC | PRN
  Administered 2020-09-09: 4 mg via INTRAVENOUS

## 2020-09-09 MED ORDER — NALOXONE HCL 0.4 MG/ML IJ SOLN: 0.4000 mg | INTRAMUSCULAR | Status: DC | PRN

## 2020-09-09 MED ORDER — DEXAMETHASONE SODIUM PHOSPHATE 4 MG/ML IJ SOLN
INTRAMUSCULAR | Status: DC | PRN
  Administered 2020-09-09: 4 mg via INTRAVENOUS

## 2020-09-09 MED ORDER — ONDANSETRON HCL 4 MG/2ML IJ SOLN: 4.0000 mg | Freq: Three times a day (TID) | INTRAMUSCULAR | Status: DC | PRN

## 2020-09-09 MED ORDER — DIPHENHYDRAMINE HCL 50 MG/ML IJ SOLN: 12.5000 mg | INTRAMUSCULAR | Status: DC | PRN

## 2020-09-09 MED ORDER — MAGNESIUM SULFATE 40 GM/1000ML IV SOLN
2.0000 g/h | INTRAVENOUS | Status: DC
  Administered 2020-09-09: 2 g/h via INTRAVENOUS
  Filled 2020-09-09: qty 1000

## 2020-09-09 MED ORDER — LABETALOL HCL 5 MG/ML IV SOLN: 80.0000 mg | INTRAVENOUS | Status: DC | PRN

## 2020-09-09 MED ORDER — DIPHENHYDRAMINE HCL 25 MG PO CAPS: 25.0000 mg | ORAL_CAPSULE | ORAL | Status: DC | PRN

## 2020-09-09 MED ORDER — LACTATED RINGERS IV SOLN: INTRAVENOUS | Status: DC | PRN

## 2020-09-09 MED ORDER — ONDANSETRON HCL 4 MG/2ML IJ SOLN: 4.0000 mg | Freq: Once | INTRAMUSCULAR | Status: DC | PRN

## 2020-09-09 MED ORDER — HYDRALAZINE HCL 20 MG/ML IJ SOLN: 10.0000 mg | INTRAMUSCULAR | Status: DC | PRN

## 2020-09-09 MED ORDER — BUTALBITAL-APAP-CAFFEINE 50-325-40 MG PO TABS
1.0000 | ORAL_TABLET | Freq: Once | ORAL | Status: AC
  Administered 2020-09-09: 1 via ORAL
  Filled 2020-09-09: qty 1

## 2020-09-09 MED ORDER — FENTANYL CITRATE (PF) 100 MCG/2ML IJ SOLN
INTRAMUSCULAR | Status: AC
  Filled 2020-09-09: qty 2

## 2020-09-09 SURGICAL SUPPLY — 31 items
BENZOIN TINCTURE PRP APPL 2/3 (GAUZE/BANDAGES/DRESSINGS) ×2 IMPLANT
CLAMP CORD UMBIL (MISCELLANEOUS) ×2 IMPLANT
CLOTH BEACON ORANGE TIMEOUT ST (SAFETY) ×2 IMPLANT
DRSG OPSITE POSTOP 4X10 (GAUZE/BANDAGES/DRESSINGS) ×2 IMPLANT
ELECT REM PT RETURN 9FT ADLT (ELECTROSURGICAL) ×2
ELECTRODE REM PT RTRN 9FT ADLT (ELECTROSURGICAL) ×1 IMPLANT
EXTRACTOR VACUUM M CUP 4 TUBE (SUCTIONS) IMPLANT
GLOVE BIOGEL PI IND STRL 7.0 (GLOVE) ×3 IMPLANT
GLOVE BIOGEL PI INDICATOR 7.0 (GLOVE) ×3
GLOVE ECLIPSE 7.0 STRL STRAW (GLOVE) ×2 IMPLANT
GOWN STRL REUS W/TWL LRG LVL3 (GOWN DISPOSABLE) ×4 IMPLANT
KIT ABG SYR 3ML LUER SLIP (SYRINGE) ×2 IMPLANT
NEEDLE HYPO 22GX1.5 SAFETY (NEEDLE) ×2 IMPLANT
NEEDLE HYPO 25X5/8 SAFETYGLIDE (NEEDLE) ×2 IMPLANT
NS IRRIG 1000ML POUR BTL (IV SOLUTION) ×2 IMPLANT
PACK C SECTION WH (CUSTOM PROCEDURE TRAY) ×2 IMPLANT
PAD ABD 7.5X8 STRL (GAUZE/BANDAGES/DRESSINGS) ×2 IMPLANT
PAD OB MATERNITY 4.3X12.25 (PERSONAL CARE ITEMS) ×2 IMPLANT
PENCIL SMOKE EVAC W/HOLSTER (ELECTROSURGICAL) ×2 IMPLANT
RTRCTR C-SECT PINK 25CM LRG (MISCELLANEOUS) ×2 IMPLANT
SPONGE GAUZE 4X4 12PLY STER LF (GAUZE/BANDAGES/DRESSINGS) ×4 IMPLANT
STRIP CLOSURE SKIN 1/2X4 (GAUZE/BANDAGES/DRESSINGS) ×2 IMPLANT
SUT MNCRL 0 VIOLET CTX 36 (SUTURE) ×3 IMPLANT
SUT MONOCRYL 0 CTX 36 (SUTURE) ×3
SUT VIC AB 0 CTX 36 (SUTURE) ×1
SUT VIC AB 0 CTX36XBRD ANBCTRL (SUTURE) ×1 IMPLANT
SUT VIC AB 4-0 KS 27 (SUTURE) ×2 IMPLANT
SYR 30ML LL (SYRINGE) ×2 IMPLANT
TOWEL OR 17X24 6PK STRL BLUE (TOWEL DISPOSABLE) ×2 IMPLANT
TRAY FOLEY W/BAG SLVR 14FR LF (SET/KITS/TRAYS/PACK) ×2 IMPLANT
WATER STERILE IRR 1000ML POUR (IV SOLUTION) ×2 IMPLANT

## 2020-09-09 NOTE — Progress Notes (Signed)
OB Note  Patient had decel at beginning of shift that lasted for about 4 minutes and responded to position changes. IVF bolus, patient made npo and repeat labs ordered. Creatinine up but rest of labs and cbc normal. Few hours ago pt reporting new HA and fioricet x 1 ordered but still with HA. Pt states it's mid to posterior and she has never had HAs before; she denies any visual s/s. While talking to her in the room she had a decel for about a minute that responded to position changes. Heart, lung exam negative and brachial reflex 2+.  I told her I'm concerned about worsening severe pre-eclampsia especially with the new onset, unremitting HA and that I recommend moving forward with delivery. I told her I don't believe baby will tolerate labor and that I recommend primary c-section. She is amenable to this. Last PO at 1530. I d/w OR and anesthesia and will prep OR for delivery  Cornelia Copa MD Attending Center for [facility] (Faculty Practice) 09/09/2020 Time: 2140

## 2020-09-09 NOTE — Progress Notes (Signed)
Patient ID: Jill Arias, female   DOB: 06-09-1996, 25 y.o.   MRN: [id number] FACULTY PRACTICE ANTEPARTUM(COMPREHENSIVE) NOTE  Jill Arias is a 25 y.o. G3P2002 at [redacted]w[redacted]d by best clinical estimate who is admitted for preeclampsia with severe features.   Fetal presentation is cephalic. Length of Stay:  2  Days  ASSESSMENT: Principal Problem:   Severe preeclampsia, third trimester Active Problems:   Marginal insertion of umbilical cord affecting management of mother   [redacted] weeks gestation of pregnancy   PLAN: Continue inpatient management Labetalol increased to 400 mg three times daily for blood pressure control. Status post betamethasone last week for preterm labor  Category 1 FHR monitoring, continue twice daily NST for fetal monitoring Status post NICU consult Plan is for delivery at 34 weeks or earlier if indicated   Subjective: Feels well, she is without complaint. Patient reports the fetal movement as active. Patient reports uterine contraction  activity as none. Patient reports  vaginal bleeding as none. Patient describes fluid per vagina as None.  Vitals:  Blood pressure (!) 146/95, pulse 62, temperature 97.7 F (36.5 C), temperature source Oral, resp. rate 18, height 5\' 8"  (1.727 m), weight 62.5 kg, last menstrual period 01/23/2020, SpO2 100 %. Physical Examination: General appearance - alert, well appearing, and in no distress Chest - normal effort Abdomen - gravid, non-tender Fundal Height:  size less than dates Extremities: Homans sign is negative, no sign of DVT  Membranes:intact  Fetal Monitoring:  Baseline: 135 bpm, Variability: Good {> 6 bpm), Accelerations: Reactive and Decelerations: Absent  Labs:  CBC Latest Ref Rng & Units 09/07/2020 08/31/2020 08/31/2020  WBC 4.0 - 10.5 K/uL 10.4 10.2 9.6  Hemoglobin 12.0 - 15.0 g/dL 10/29/2020 10.4(L) 11.7(L)  Hematocrit 36.0 - 46.0 % 35.3(L) 29.4(L) 35.0(L)  Platelets 150 - 400 K/uL 187 230 259   CMP Latest Ref Rng &  Units 09/07/2020 08/31/2020 12/09/2017  Glucose 70 - 99 mg/dL 71 76 94  BUN 6 - 20 mg/dL 15 14 11   Creatinine 0.44 - 1.00 mg/dL 02/08/2018 1.74  Sodium 135 - 145 mmol/L 133(L) 136 136  Potassium 3.5 - 5.1 mmol/L 4.0 3.6 4.0  Chloride 98 - 111 mmol/L 102 106 106  CO2 22 - 32 mmol/L 22 22 21(L)  Calcium 8.9 - 10.3 mg/dL 9.2 9.0 0.81)  Total Protein 6.5 - 8.1 g/dL 6.0(L) 6.3(L) 6.4(L)  Total Bilirubin 0.3 - 1.2 mg/dL 0.6 0.4 0.4  Alkaline Phos 38 - 126 U/L 178(H) 169(H) 212(H)  AST 15 - 41 U/L 20 18 21   ALT 0 - 44 U/L 12 8 13(L)    Imaging Studies:    09/07/2020  Marginal cord insertion.  Patient returned for fetal growth  assessment.  Blood pressures today at our office were  148/99 and repeat 133/85 mmHg.  Patient does not have  symptoms of severe features of preeclampsia.  On today's ultrasound, the estimated fetal weight is at the 5th percentile.  AC is at the 9th percentile and head  circumference measurement is at between -2 and -1 SD  (normal).  Amniotic fluid is normal and good fetal activity  seen.  Cephalic presentation.  Clinical artery Doppler showed increased S/D ratio.  NST is not reactive. BPP 8/10.  I explained fetal growth restriction.  Most likely cause is placental insufficiency.  After NST, she had BP readings of 179/118 and 181/117 mm Hg. Patient does not have symptoms. I explained the possibility of preeclampsia and recommended that she be  evaluated at  the MAU. Patient agreed to go to the MAU. MAU team was informed. ---------------------------------------------------------------------- Recommendations  Continue weekly BPP, NST and UA Doppler studies till delivery if discharged from the MAU.  Medications:  Scheduled . docusate sodium  100 mg Oral Daily  . labetalol  400 mg Oral Q8H  . prenatal multivitamin  1 tablet Oral Q1200  . sodium chloride flush  3 mL Intravenous Q12H   I have reviewed the patient's current medications.   Jaynie Collins, MD 09/09/2020,7:11  AM

## 2020-09-09 NOTE — Op Note (Signed)
Operative Note   SURGERY DATE: 09/09/2020  PRE-OP DIAGNOSIS:  *Pregnancy at 32/6 *Unremitting headache *Severe pre-eclampsia *Fetal growth restriction  POST-OP DIAGNOSIS: Same. Delivered   PROCEDURE: primary low transverse cesarean section via pfannenstiel skin incision with double layer uterine closure  SURGEON: Surgeon(s) and Role:     Bing, MD - Primary  ASSISTANT: None  ANESTHESIA: spinal  ESTIMATED BLOOD LOSS:  DRAINS: UOP via indwelling foley  TOTAL IV FLUIDS: crystalloid  VTE PROPHYLAXIS: SCDs to bilateral lower extremities  ANTIBIOTICS: Two grams of Cefazolin were given., within 1 hour of skin incision  SPECIMENS: placenta  COMPLICATIONS: none  FINDINGS: No intra-abdominal adhesions were noted. Grossly normal uterus, tubes and ovaries. Clear amniotic fluid, cephalic, female infant, weight 1570gm, APGARs 8/9, intact placenta.  PROCEDURE IN DETAIL: The patient was taken to the operating room where anesthesia was administered and normal fetal heart tones were confirmed. She was then prepped and draped in the normal fashion in the dorsal supine position with a leftward tilt.  After a time out was performed, a pfannensteil skin incision was made with the scalpel and carried through to the underlying layer of fascia. The fascia was then incised at the midline and this incision was extended laterally with the mayo scissors. Attention was turned to the superior aspect of the fascial incision which was grasped with the kocher clamps x 2, tented up and the rectus muscles were dissected off with the scalpel. In a similar fashion the inferior aspect of the fascial incision was grasped with the kocher clamps, tented up and the rectus muscles dissected off with the mayo scissors. The rectus muscles were then separated in the midline and the peritoneum was entered bluntly. The bladder blade was inserted and the vesicouterine peritoneum was identified,  tented up and entered with the metzenbaum scissors. This incision was extended laterally and the bladder flap was created digitally. The bladder blade was reinserted.  A low transverse hysterotomy was made with the scalpel until the endometrial cavity was breached and the amniotic sac ruptured with the Allis clamp, yielding clear amniotic fluid. This incision was extended bluntly and the infant's head, shoulders and body were delivered atraumatically.The cord was clamped x 2 and cut, and the infant was handed to the awaiting pediatricians, after delayed cord clamping was done for 30 seconds.  The placenta was then gradually expressed from the uterus and then the uterus was exteriorized and cleared of all clots and debris. The hysterotomy was repaired with a running suture of 1-0 monocryl. A second imbricating layer of 1-0 monocryl suture was then placed. Several figure-of-eight sutures of 1-0 monocryl on the left hysterotomy were added to achieve excellent hemostasis.   The uterus and adnexa were then returned to the abdomen, and the hysterotomy and all operative sites were reinspected and excellent hemostasis was noted after irrigation and suction of the abdomen with warm saline.   The fascia was reapproximated with 0 Vicryl in a simple running fashion bilaterally. The subcutaneous layer was then reapproximated with interrupted sutures of 2-0 plain gut, and the skin was then closed with 4-0 monocryl, in a subcuticular fashion.  The patient  tolerated the procedure well. Sponge, lap, needle, and instrument counts were correct x 2. The patient was transferred to the recovery room awake, alert and breathing independently in stable condition.  Cornelia Copa MD Attending Center for [facility])

## 2020-09-09 NOTE — Transfer of Care (Signed)
Immediate Anesthesia Transfer of Care Note  Patient: Jill Arias  Procedure(s) Performed: CESAREAN SECTION  Patient Location: PACU  Anesthesia Type:Spinal  Level of Consciousness: awake, alert  and oriented  Airway & Oxygen Therapy: Patient Spontanous Breathing  Post-op Assessment: Report given to RN and Post -op Vital signs reviewed and stable  Post vital signs: Reviewed and stable  Last Vitals:  Vitals Value Taken Time  BP 126/72 09/09/20 2346  Temp    Pulse 73 09/09/20 2353  Resp 15 09/09/20 2353  SpO2 100 % 09/09/20 2353  Vitals shown include unvalidated device data.  Last Pain:  Vitals:   09/09/20 2102  TempSrc:   PainSc: 5       Patients Stated Pain Goal: 2 (09/09/20 1946)  Complications: No complications documented.

## 2020-09-09 NOTE — Anesthesia Procedure Notes (Signed)
Spinal  Patient location during procedure: OB Start time: 09/09/2020 10:30 PM End time: 09/09/2020 10:34 PM Staffing Performed: anesthesiologist  Anesthesiologist: Trevor Iha, MD Preanesthetic Checklist Completed: patient identified, IV checked, risks and benefits discussed, surgical consent, monitors and equipment checked, pre-op evaluation and timeout performed Spinal Block Patient position: sitting Prep: DuraPrep and site prepped and draped Patient monitoring: heart rate, cardiac monitor, continuous pulse ox and blood pressure Approach: midline Location: L3-4 Injection technique: single-shot Needle Needle type: Pencan  Needle gauge: 24 G Needle length: 10 cm Needle insertion depth: 6 cm Assessment Sensory level: T4 Additional Notes  1 Attempt (s). Pt tolerated procedure well.

## 2020-09-09 NOTE — Anesthesia Preprocedure Evaluation (Signed)
Anesthesia Evaluation  Patient identified by MRN, date of birth, ID band Patient awake    Reviewed: Allergy & Precautions, NPO status , Patient's Chart, lab work & pertinent test results  Airway Mallampati: II  TM Distance: >3 FB Neck ROM: Full    Dental no notable dental hx. (+) Teeth Intact, Dental Advisory Given   Pulmonary neg pulmonary ROS,    Pulmonary exam normal breath sounds clear to auscultation       Cardiovascular hypertension (PIH), Pt. on medications and Pt. on home beta blockers Normal cardiovascular exam Rhythm:Regular Rate:Normal  Mg++   Neuro/Psych negative neurological ROS  negative psych ROS   GI/Hepatic negative GI ROS, Neg liver ROS,   Endo/Other  negative endocrine ROS  Renal/GU Cr 1.01     Musculoskeletal negative musculoskeletal ROS (+)   Abdominal   Peds  Hematology Lab Results      Component                Value               Date                      WBC                      9.6                 09/09/2020                HGB                      11.1 (L)            09/09/2020                HCT                      33.0 (L)            09/09/2020                MCV                      88.0                09/09/2020                PLT                      158                 09/09/2020              Anesthesia Other Findings   Reproductive/Obstetrics (+) Pregnancy                             Anesthesia Physical Anesthesia Plan  ASA: III  Anesthesia Plan: Spinal   Post-op Pain Management:    Induction:   PONV Risk Score and Plan: 3 and Treatment may vary due to age or medical condition and Ondansetron  Airway Management Planned: Natural Airway and Nasal Cannula  Additional Equipment: None  Intra-op Plan:   Post-operative Plan:   Informed Consent: I have reviewed the patients History and Physical, chart, labs and discussed the procedure including  the risks, benefits and alternatives for the proposed anesthesia with the patient or  authorized representative who has indicated his/her understanding and acceptance.       Plan Discussed with: CRNA and Anesthesiologist  Anesthesia Plan Comments: (32.6 Wk G3P2 w severe PIH for C/S under spinal)        Anesthesia Quick Evaluation

## 2020-09-10 ENCOUNTER — Encounter (HOSPITAL_COMMUNITY): Payer: Self-pay | Admitting: Obstetrics and Gynecology

## 2020-09-10 LAB — CBC
HCT: 31 % — ABNORMAL LOW (ref 36.0–46.0)
Hemoglobin: 10.5 g/dL — ABNORMAL LOW (ref 12.0–15.0)
MCH: 30.3 pg (ref 26.0–34.0)
MCHC: 33.9 g/dL (ref 30.0–36.0)
MCV: 89.6 fL (ref 80.0–100.0)
Platelets: 133 10*3/uL — ABNORMAL LOW (ref 150–400)
RBC: 3.46 MIL/uL — ABNORMAL LOW (ref 3.87–5.11)
RDW: 13.2 % (ref 11.5–15.5)
WBC: 18.2 10*3/uL — ABNORMAL HIGH (ref 4.0–10.5)
nRBC: 0 % (ref 0.0–0.2)

## 2020-09-10 LAB — COMPREHENSIVE METABOLIC PANEL
ALT: 23 U/L (ref 0–44)
AST: 34 U/L (ref 15–41)
Albumin: 2.6 g/dL — ABNORMAL LOW (ref 3.5–5.0)
Alkaline Phosphatase: 135 U/L — ABNORMAL HIGH (ref 38–126)
Anion gap: 9 (ref 5–15)
BUN: 15 mg/dL (ref 6–20)
CO2: 22 mmol/L (ref 22–32)
Calcium: 8 mg/dL — ABNORMAL LOW (ref 8.9–10.3)
Chloride: 102 mmol/L (ref 98–111)
Creatinine, Ser: 0.91 mg/dL (ref 0.44–1.00)
GFR, Estimated: >60 mL/min (ref >60)
Glucose, Bld: 105 mg/dL — ABNORMAL HIGH (ref 70–99)
Potassium: 5 mmol/L (ref 3.5–5.1)
Sodium: 133 mmol/L — ABNORMAL LOW (ref 135–145)
Total Bilirubin: 0.5 mg/dL (ref 0.3–1.2)
Total Protein: 5.2 g/dL — ABNORMAL LOW (ref 6.5–8.1)

## 2020-09-10 LAB — TYPE AND SCREEN
ABO/RH(D): A NEG
Antibody Screen: POSITIVE

## 2020-09-10 MED ORDER — TETANUS-DIPHTH-ACELL PERTUSSIS 5-2.5-18.5 LF-MCG/0.5 IM SUSY: 0.5000 mL | PREFILLED_SYRINGE | Freq: Once | INTRAMUSCULAR | Status: DC

## 2020-09-10 MED ORDER — OXYCODONE HCL 5 MG PO TABS
5.0000 mg | ORAL_TABLET | ORAL | Status: DC | PRN
  Administered 2020-09-10 (×3): 5 mg via ORAL
  Administered 2020-09-11 – 2020-09-12 (×5): 10 mg via ORAL
  Filled 2020-09-10 (×3): qty 2
  Filled 2020-09-10 (×2): qty 1
  Filled 2020-09-10: qty 2
  Filled 2020-09-10: qty 1
  Filled 2020-09-10: qty 2

## 2020-09-10 MED ORDER — SIMETHICONE 80 MG PO CHEW
80.0000 mg | CHEWABLE_TABLET | Freq: Three times a day (TID) | ORAL | Status: DC
  Administered 2020-09-10 – 2020-09-12 (×6): 80 mg via ORAL
  Filled 2020-09-10 (×6): qty 1

## 2020-09-10 MED ORDER — ENOXAPARIN SODIUM 40 MG/0.4ML ~~LOC~~ SOLN: 40.0000 mg | SUBCUTANEOUS | Status: DC

## 2020-09-10 MED ORDER — MAGNESIUM SULFATE 40 GM/1000ML IV SOLN
2.0000 g/h | INTRAVENOUS | Status: AC
  Administered 2020-09-10: 2 g/h via INTRAVENOUS
  Filled 2020-09-10: qty 1000

## 2020-09-10 MED ORDER — DIPHENHYDRAMINE HCL 25 MG PO CAPS: 25.0000 mg | ORAL_CAPSULE | Freq: Four times a day (QID) | ORAL | Status: DC | PRN

## 2020-09-10 MED ORDER — POLYETHYLENE GLYCOL 3350 17 G PO PACK
17.0000 g | PACK | Freq: Every day | ORAL | Status: DC
  Administered 2020-09-11: 17 g via ORAL
  Filled 2020-09-10 (×2): qty 1

## 2020-09-10 MED ORDER — LACTATED RINGERS IV SOLN: INTRAVENOUS | Status: DC

## 2020-09-10 MED ORDER — KETOROLAC TROMETHAMINE 30 MG/ML IJ SOLN
INTRAMUSCULAR | Status: AC
  Filled 2020-09-10: qty 1

## 2020-09-10 MED ORDER — NIFEDIPINE ER OSMOTIC RELEASE 30 MG PO TB24: 30.0000 mg | ORAL_TABLET | Freq: Every day | ORAL | Status: DC

## 2020-09-10 MED ORDER — RHO D IMMUNE GLOBULIN 1500 UNIT/2ML IJ SOSY
300.0000 ug | PREFILLED_SYRINGE | Freq: Once | INTRAMUSCULAR | Status: AC
  Administered 2020-09-10: 300 ug via INTRAVENOUS
  Filled 2020-09-10: qty 2

## 2020-09-10 MED ORDER — KETOROLAC TROMETHAMINE 30 MG/ML IJ SOLN
INTRAMUSCULAR | Status: DC | PRN
  Administered 2020-09-10: 30 mg via INTRAVENOUS

## 2020-09-10 MED ORDER — WITCH HAZEL-GLYCERIN EX PADS: 1.0000 "application " | MEDICATED_PAD | CUTANEOUS | Status: DC | PRN

## 2020-09-10 MED ORDER — OXYTOCIN-SODIUM CHLORIDE 30-0.9 UT/500ML-% IV SOLN: 2.5000 [IU]/h | INTRAVENOUS | Status: AC

## 2020-09-10 MED ORDER — GABAPENTIN 100 MG PO CAPS
100.0000 mg | ORAL_CAPSULE | Freq: Two times a day (BID) | ORAL | Status: DC
  Administered 2020-09-10 – 2020-09-12 (×6): 100 mg via ORAL
  Filled 2020-09-10 (×6): qty 1

## 2020-09-10 MED ORDER — DIBUCAINE (PERIANAL) 1 % EX OINT
1.0000 | TOPICAL_OINTMENT | CUTANEOUS | Status: DC | PRN
Start: 2020-09-09 — End: 2020-09-12

## 2020-09-10 MED ORDER — PRENATAL MULTIVITAMIN CH
1.0000 | ORAL_TABLET | Freq: Every day | ORAL | Status: DC
  Administered 2020-09-10 – 2020-09-11 (×2): 1 via ORAL
  Filled 2020-09-10 (×2): qty 1

## 2020-09-10 MED ORDER — MENTHOL 3 MG MT LOZG
1.0000 | LOZENGE | OROMUCOSAL | Status: DC | PRN
Start: 2020-09-10 — End: 2020-09-12

## 2020-09-10 MED ORDER — SCOPOLAMINE 1 MG/3DAYS TD PT72
MEDICATED_PATCH | TRANSDERMAL | Status: AC
  Filled 2020-09-10: qty 1

## 2020-09-10 MED ORDER — COCONUT OIL OIL: 1.0000 "application " | TOPICAL_OIL | Status: DC | PRN

## 2020-09-10 NOTE — Anesthesia Postprocedure Evaluation (Signed)
Anesthesia Post Note  Patient: Jill Arias  Procedure(s) Performed: CESAREAN SECTION     Patient location during evaluation: Mother Baby Anesthesia Type: Spinal Level of consciousness: oriented and awake and alert Pain management: pain level controlled Vital Signs Assessment: post-procedure vital signs reviewed and stable Respiratory status: spontaneous breathing and respiratory function stable Cardiovascular status: blood pressure returned to baseline and stable Postop Assessment: no headache, no backache, no apparent nausea or vomiting and able to ambulate Anesthetic complications: no   No complications documented.  Last Vitals:  Vitals:   09/10/20 0030 09/10/20 0035  BP: 124/79   Pulse: (!) 59 65  Resp: 18 20  Temp:    SpO2: 100% 100%    Last Pain:  Vitals:   09/10/20 0021  TempSrc:   PainSc: 6    Pain Goal: Patients Stated Pain Goal: 2 (09/09/20 1946)  LLE Motor Response: Purposeful movement (09/10/20 0035) LLE Sensation: Tingling (09/10/20 0035) RLE Motor Response: Purposeful movement (09/10/20 0035) RLE Sensation: Tingling (09/10/20 0035)     Epidural/Spinal Function Cutaneous sensation: Tingles (09/10/20 0021), Patient able to flex knees: No (09/10/20 0021), Patient able to lift hips off bed: No (09/10/20 0021), Back pain beyond tenderness at insertion site: No (09/10/20 0021), Progressively worsening motor and/or sensory loss: No (09/10/20 0021), Bowel and/or bladder incontinence post epidural: No (09/10/20 0021)  Trevor Iha

## 2020-09-10 NOTE — Progress Notes (Signed)
Pt OOB to bathroom. Pt reports feeling dizzy. Pt returned to bed. BP 115/58 P59 RR 16. Carmelina Dane, RN

## 2020-09-10 NOTE — Lactation Note (Signed)
This note was copied from a baby's chart. Lactation Consultation Note  Patient Name: Jill Arias XNATF'T Date: 09/10/2020 Reason for consult: Initial assessment;NICU baby Age:23 hours  Mother was admitted as planned formula. She did not bf her other children. She has decided to try pumping and offer her milk because of infant's NICU status. LC set up pump and assisted with initial pumping. Ed provided on cleaning pump and milk storage. Mother will contact her insurance provider about a pump p d/c. She also participates in [facility] and is aware that she can receive a pump through that program.  LC provided NICU booklet and lactation services hand out. Visitor arrived prior to [facility] teaching HE. Spoke with RN who will demonstrate HE p mom's visitor leaves.   Maternal Data Has patient been taught Hand Expression?: No Does the patient have breastfeeding experience prior to this delivery?: No No hx breast surgery/trauma. Breast symmetry and nipples wnl  Feeding Mother's Current Feeding Choice: Breast Milk and Formula   Consult Status Consult Status: Follow-up Follow-up type: In-patient   Elder Negus, MA IBCLC 09/10/2020, 11:23 AM

## 2020-09-10 NOTE — Progress Notes (Signed)
Patient screened out for psychosocial assessment since none of the following apply:  Psychosocial stressors documented in mother or baby's chart  Gestation less than 32 weeks  Code at delivery   Infant with anomalies Please contact the Clinical Social Worker if specific needs arise or by MOB's request.   Leslieann Whisman Boyd-Gilyard, MSW, LCSW Clinical Social Work [phone] 

## 2020-09-10 NOTE — Progress Notes (Signed)
Daily Antepartum Note  Admission Date: 09/07/2020 Current Date: 09/10/2020 7:53 AM  Jill Arias is a 25 y.o. [id number] POD#1 pLTCS @ 32/6, for worsening severe pre-eclampsia with FGR and fetal decels.  Pregnancy complicated by: Patient Active Problem List   Diagnosis Date Noted  . Poor fetal growth affecting management of mother in third trimester   . Severe preeclampsia, third trimester 09/07/2020  . [redacted] weeks gestation of pregnancy 09/01/2020  . Preterm contractions 08/31/2020  . History of COVID-19 08/30/2020  . Marginal insertion of umbilical cord affecting management of mother 07/21/2020  . Supervision of high-risk pregnancy 04/27/2020  . Rh negative state in antepartum period 10/23/2017    Overnight/24hr events:  none  Subjective:  Foley still in. Pain controlled and taking some PO. No ambulation yet. No s/s of pre-eclampsia or Mg toxicity  Objective:      Current Vital Signs 24h Vital Sign Ranges  T 97.7 F (36.5 C) Temp  Avg: 97.8 F (36.6 C)  Min: 96.2 F (35.7 C)  Max: 98.8 F (37.1 C)  BP (!) 118/59 BP  Min: 114/65  Max: 146/99  HR 74 Pulse  Avg: 67.5  Min: 59  Max: 90  RR 18 Resp  Avg: 16.7  Min: 10  Max: 22  SaO2 99 %  (Room Air) SpO2  Avg: 99.2 %  Min: 96 %  Max: 100 %       24 Hour I/O Current Shift I/O  Time Ins Outs 02/10 0701 - 02/11 0700 In: 1430 [P.O.:180; I.V.:1200] Out: 1829 [Urine:1325] No intake/output data recorded.   Patient Vitals for the past 24 hrs:  BP Temp Temp src Pulse Resp SpO2  09/10/20 0739 (!) 118/59 97.7 F (36.5 C) Oral 74 18 99 %  09/10/20 0608 (!) 119/58 -- -- 69 -- --  09/10/20 0605 -- -- -- -- -- 99 %  09/10/20 0600 -- -- -- -- -- 99 %  09/10/20 0555 -- -- -- -- -- 99 %  09/10/20 0550 -- -- -- -- -- 98 %  09/10/20 0545 -- -- -- -- -- 97 %  09/10/20 0540 -- -- -- -- -- 97 %  09/10/20 0535 -- -- -- -- -- 97 %  09/10/20 0530 -- -- -- -- -- 96 %  09/10/20 0525 -- -- -- -- -- 96 %  09/10/20 0520 -- -- -- -- -- 96 %   09/10/20 0515 -- -- -- -- -- 97 %  09/10/20 0510 -- -- -- -- -- 97 %  09/10/20 0501 (!) 118/59 -- -- 73 -- --  09/10/20 0500 -- -- -- -- -- 96 %  09/10/20 0455 -- -- -- -- -- 96 %  09/10/20 0450 -- -- -- -- -- 96 %  09/10/20 0445 -- -- -- -- -- 96 %  09/10/20 0440 -- -- -- -- -- 96 %  09/10/20 0435 -- -- -- -- -- 96 %  09/10/20 0430 -- -- -- -- -- 96 %  09/10/20 0425 -- -- -- -- -- 96 %  09/10/20 0420 -- -- -- -- -- 96 %  09/10/20 0415 -- -- -- -- -- 96 %  09/10/20 0410 -- -- -- -- -- 97 %  09/10/20 0405 -- -- -- -- -- 97 %  09/10/20 0401 119/61 -- -- 72 16 --  09/10/20 0400 -- -- -- -- -- 98 %  09/10/20 0300 120/66 -- -- 66 17 99 %  09/10/20 0205 -- -- -- -- -- 100 %  09/10/20 0201 120/67 -- -- 64 18 --  09/10/20 0200 -- -- -- -- -- 100 %  09/10/20 0134 123/64 97.6 F (36.4 C) Oral 64 18 100 %  09/10/20 0107 -- -- -- -- 17 --  09/10/20 0106 -- -- -- -- 20 --  09/10/20 0105 -- -- -- 63 14 100 %  09/10/20 0104 -- -- -- 68 17 100 %  09/10/20 0103 -- -- -- 70 18 100 %  09/10/20 0102 -- -- -- 64 13 100 %  09/10/20 0101 118/66 -- -- 67 15 100 %  09/10/20 0100 -- -- -- 65 17 100 %  09/10/20 0059 -- -- -- 64 13 100 %  09/10/20 0058 -- -- -- 63 14 100 %  09/10/20 0057 -- -- -- 65 14 100 %  09/10/20 0056 -- -- -- 65 20 100 %  09/10/20 0055 -- -- -- 66 10 100 %  09/10/20 0054 -- -- -- 65 18 100 %  09/10/20 0053 -- -- -- 64 17 100 %  09/10/20 0052 -- -- -- 64 (!) 21 100 %  09/10/20 0051 -- (!) 96.2 F (35.7 C) Axillary 60 16 100 %  09/10/20 0050 -- -- -- 64 15 100 %  09/10/20 0049 -- -- -- 69 15 100 %  09/10/20 0048 -- -- -- 68 17 100 %  09/10/20 0047 -- -- -- 67 16 100 %  09/10/20 0046 -- -- -- 70 (!) 22 100 %  09/10/20 0045 130/82 -- -- 68 14 100 %  09/10/20 0044 -- -- -- 64 14 100 %  09/10/20 0043 -- -- -- 64 16 100 %  09/10/20 0042 -- -- -- 64 18 100 %  09/10/20 0041 -- -- -- 62 18 100 %  09/10/20 0040 -- -- -- 62 19 100 %  09/10/20 0039 -- -- -- 63 14 100 %  09/10/20  0038 -- -- -- 66 18 100 %  09/10/20 0037 -- -- -- 70 (!) 21 100 %  09/10/20 0036 -- -- -- 67 15 100 %  09/10/20 0035 -- -- -- 65 20 100 %  09/10/20 0034 -- -- -- 63 19 100 %  09/10/20 0033 -- -- -- 63 19 100 %  09/10/20 0032 123/83 -- -- 62 15 100 %  09/10/20 0031 -- -- -- (!) 59 16 100 %  09/10/20 0030 124/79 -- -- (!) 59 18 100 %  09/10/20 0029 -- -- -- 62 16 100 %  09/10/20 0028 -- -- -- 62 19 100 %  09/10/20 0027 -- -- -- 61 16 100 %  09/10/20 0026 -- -- -- 62 20 100 %  09/10/20 0025 -- -- -- 66 11 100 %  09/10/20 0024 -- -- -- 65 17 100 %  09/10/20 0023 -- -- -- 64 17 100 %  09/10/20 0022 -- -- -- 67 14 100 %  09/10/20 0021 -- -- -- 62 19 100 %  09/10/20 0020 -- -- -- 62 15 100 %  09/10/20 0019 -- -- -- 65 17 100 %  09/10/20 0018 -- -- -- 63 18 100 %  09/10/20 0017 -- -- -- 66 16 100 %  09/10/20 0016 -- -- -- 63 16 100 %  09/10/20 0015 133/76 -- -- 69 16 100 %  09/10/20 0014 -- -- -- 71 18 100 %  09/10/20 0013 -- -- -- 64 20 100 %  09/10/20 0012 -- -- -- 66 16 100 %  09/10/20 0011 -- -- -- 72 19 100 %  09/10/20 0010 -- -- -- 68 17 100 %  09/10/20 0009 -- -- -- 71 20 100 %  09/10/20 0008 -- -- -- 69 17 100 %  09/10/20 0007 -- -- -- 64 14 100 %  09/10/20 0006 -- -- -- 71 14 100 %  09/10/20 0005 -- -- -- 79 18 100 %  09/10/20 0004 -- -- -- 70 19 100 %  09/10/20 0003 -- -- -- 69 (!) 22 100 %  09/10/20 0002 -- -- -- 65 19 100 %  09/10/20 0001 -- -- -- 65 17 100 %  09/10/20 0000 131/84 -- -- 69 10 100 %  09/09/20 2352 -- -- -- 71 16 100 %  09/09/20 2348 -- -- -- 72 17 100 %  09/09/20 2347 -- 97.8 F (36.6 C) Oral 71 10 100 %  09/09/20 2345 126/72 -- -- -- 16 --  09/09/20 2216 139/89 -- -- 83 16 --  09/09/20 2211 140/90 -- -- 82 18 --  09/09/20 2158 (!) 146/99 -- -- 73 18 --  09/09/20 1943 (!) 144/94 98.8 F (37.1 C) Oral 73 18 100 %  09/09/20 1745 -- -- -- -- -- 99 %  09/09/20 1710 -- -- -- -- -- 99 %  09/09/20 1510 136/76 -- -- 74 17 --  09/09/20 1453 136/76 -- --  76 -- --  09/09/20 1451 136/76 -- -- 90 -- --  09/09/20 1205 122/70 98.4 F (36.9 C) Oral 85 16 98 %  09/09/20 0827 114/65 98.1 F (36.7 C) Oral 85 17 99 %   UOP: >152mL/hr  Physical exam: General: Well nourished, well developed female in no acute distress. Abdomen: rare BS, pressure dressing c/d/i GU: foley with 65mL UOP clear Cardiovascular: S1, S2 normal, no murmur, rub or gallop, regular rate and rhythm Respiratory: CTAB Extremities: no clubbing, cyanosis or edema Skin: Warm and dry.   Medications: Current Facility-Administered Medications  Medication Dose Route Frequency Provider Last Rate Last Admin  . ceFAZolin (ANCEF) IVPB 2g/100 mL premix  2 g Intravenous 30 min Pre-Op [place] Bing, MD      . coconut oil  1 application Topical PRN [place] Bing, MD      . witch hazel-glycerin (TUCKS) pad 1 application  1 application Topical PRN [place] Bing, MD       And  . dibucaine (NUPERCAINAL) 1 % rectal ointment 1 application  1 application Rectal PRN [place] Bing, MD      . diphenhydrAMINE (BENADRYL) injection 12.5 mg  12.5 mg Intravenous Q4H PRN Trevor Iha, MD       Or  . diphenhydrAMINE (BENADRYL) capsule 25 mg  25 mg Oral Q4H PRN Trevor Iha, MD      . diphenhydrAMINE (BENADRYL) capsule 25 mg  25 mg Oral Q6H PRN [place] Bing, MD      . enoxaparin (LOVENOX) injection 40 mg  40 mg Subcutaneous Q24H [place] Bing, MD      . gabapentin (NEURONTIN) capsule 100 mg  100 mg Oral BID [place] Bing, MD   100 mg at 09/10/20 0250  . labetalol (NORMODYNE) injection 20 mg  20 mg Intravenous PRN [place] Bing, MD   20 mg at 09/08/20 1712   And  . labetalol (NORMODYNE) injection 40 mg  40 mg Intravenous PRN [place] Bing, MD   40 mg at 09/08/20 1724   And  . labetalol (NORMODYNE) injection 80 mg  80 mg Intravenous PRN Mike Hamre,  [place]rington Studnicka, MD   80 mg at 09/08/20 1746   And  . hydrALAZINE (APRESOLINE) injection 10 mg  10 mg Intravenous PRN [place] Bing, MD   10 mg at 09/08/20 1757  . lactated ringers infusion   Intravenous Continuous [place] Bing, MD 75 mL/hr at 09/10/20 0401 New Bag at 09/10/20 0401  . magnesium sulfate 40 grams in SWI 1000 mL OB infusion  2 g/hr Intravenous Continuous [place] Bing, MD      . menthol-cetylpyridinium (CEPACOL) lozenge 3 mg  1 lozenge Oral Q2H PRN [place] Bing, MD      . nalbuphine (NUBAIN) injection 5 mg  5 mg Intravenous Q4H PRN Trevor Iha, MD       Or  . nalbuphine (NUBAIN) injection 5 mg  5 mg Subcutaneous Q4H PRN Trevor Iha, MD      . nalbuphine (NUBAIN) injection 5 mg  5 mg Intravenous Once PRN Trevor Iha, MD       Or  . nalbuphine (NUBAIN) injection 5 mg  5 mg Subcutaneous Once PRN Trevor Iha, MD      . naloxone [facility]) injection 0.4 mg  0.4 mg Intravenous PRN Trevor Iha, MD       And  . sodium chloride flush (NS) 0.9 % injection 3 mL  3 mL Intravenous PRN Trevor Iha, MD      . naloxone HCl (NARCAN) 2 mg in dextrose 5 % 250 mL infusion  1-4 mcg/kg/hr Intravenous Continuous PRN Trevor Iha, MD      . ondansetron (ZOFRAN) injection 4 mg  4 mg Intravenous Q8H PRN Trevor Iha, MD      . oxyCODONE (Oxy IR/ROXICODONE) immediate release tablet 5-10 mg  5-10 mg Oral Q4H PRN [place] Bing, MD      . oxytocin (PITOCIN) IV infusion 30 units in NS 500 mL - Premix  2.5 Units/hr Intravenous Continuous [place] Bing, MD      . polyethylene glycol (MIRALAX / GLYCOLAX) packet 17 g  17 g Oral Q supper [place] Bing, MD      . prenatal multivitamin tablet 1 tablet  1 tablet Oral Q1200 [place] Bing, MD      . scopolamine (TRANSDERM-SCOP) 1 MG/3DAYS 1.5 mg  1 patch Transdermal Once [place] Iha, MD   1.5 mg at 09/10/20 0025  . simethicone (MYLICON) chewable tablet 80 mg  80 mg Oral TID PC [place] Bing, MD      . Tdap (BOOSTRIX) injection 0.5 mL  0.5 mL Intramuscular Once  Bing, MD        Labs:  Recent Labs  Lab  09/07/20 1423 09/09/20 1756 09/10/20 0540  WBC 10.4 9.6 18.2*  HGB 12.6 11.1* 10.5*  HCT 35.3* 33.0* 31.0*  PLT 187 158 133*    Recent Labs  Lab 09/07/20 1423 09/09/20 1756 09/10/20 0540  NA 133* 135 133*  K 4.0 4.1 5.0  CL 102 105 102  CO2 22 21* 22  BUN 15 18 15   CREATININE 0.80 1.01* 0.91  CALCIUM 9.2 8.4* 8.0*  PROT 6.0* 5.4* 5.2*  BILITOT 0.6 0.6 0.5  ALKPHOS 178* 161* 135*  ALT 12 11 23   AST 20 21 34  GLUCOSE 71 85 105*     Radiology:  No new imaging  Assessment & Plan:  Pt doing well *PP: routine care. Rhogam ordered. Formula. Follow up birth control tomorrow. Girl  *Severe pre-eclampsia: Mg until 2300 tonight. Watch BPs. Pt was on labetalol tid and  procardia bid before delivery *PPx: lovenox tonight *FEN/GI: regular diet, MIVF with Mg *Dispo: likely pod#3  Cornelia Copa MD Attending Center [facility] (Faculty Practice) GYN Consult Phone: [phone] (M-F, 0800-1700) & [phone] (Off hours, weekends, holidays)

## 2020-09-10 NOTE — Progress Notes (Signed)
Patient surgical dressing changed between 2050-2105.   During routine patient assessment @2000  patient pressure dressing had softball sized pink spot noted under top layer of dressing. RN able to pull up pressure dressing and assessed honeycomb dressing fully saturated. MD called and updated, orders received to change honeycomb and pressure dressings.   Pressure dressing, honeycomb dressing, and steri strips removed. Wound site assessed. Edges approximated, no discoloration or odor noted, patient consistently trickling minimal bright red blood on left side. Patient continued to trickle even when blood wiped with gauze or pressure held. New steri strips applied. 4x4 gauze pad added over left side of wound and honeycomb placed over top of gauze. Pressure dressing applied over honeycomb.   Dressing changed assisted by RN Earlene Plater.

## 2020-09-11 DIAGNOSIS — Z98891 History of uterine scar from previous surgery: Secondary | ICD-10-CM

## 2020-09-11 DIAGNOSIS — O1415 Severe pre-eclampsia, complicating the puerperium: Secondary | ICD-10-CM

## 2020-09-11 LAB — RH IG WORKUP (INCLUDES ABO/RH)
ABO/RH(D): A NEG
Fetal Screen: NEGATIVE
Gestational Age(Wks): 32
Unit division: 0

## 2020-09-11 MED ORDER — IBUPROFEN 600 MG PO TABS: 600.0000 mg | ORAL_TABLET | Freq: Four times a day (QID) | ORAL | Status: DC | PRN

## 2020-09-11 NOTE — Progress Notes (Addendum)
RN called to room by nurse tech @0605  due to patient gown having fist size spot of blood on it and bloody drainage on bed pad. RN assessed dressing site and noted golf ball sized saturated bloody drainage on right side of pressure dressing, honey comb dressing between one third to one half saturated with red bleeding, and the four by four gauze underneath the honeycomb dressing was completely saturated. RN called Dr. @0611  to update MD. MD unable to take report at time of RN call, MD to called back RN.  Addendum: Dr. Earlene Plater notified @0657 . MD to assess patient incision later today.

## 2020-09-11 NOTE — Progress Notes (Signed)
POSTPARTUM PROGRESS NOTE  POD #2  Subjective:  Jill Arias is a 25 y.o. [phone] s/p primary LTCS at [redacted]w[redacted]d.  She reports she doing well. No acute events overnight. She denies any problems with ambulating, voiding or po intake. Denies nausea or vomiting. She has  passed flatus. Pain is well controlled.  Lochia is minimal.  Objective: Blood pressure 125/74, pulse 68, temperature 98.4 F (36.9 C), temperature source Oral, resp. rate 16, height 5\' 8"  (1.727 m), weight 62.5 kg, last menstrual period 01/23/2020, SpO2 100 %, unknown if currently breastfeeding.  Physical Exam:  General: alert, cooperative and no distress Chest: no respiratory distress Heart:regular rate, distal pulses intact Abdomen: soft, nontender,  Uterine Fundus: firm, appropriately tender DVT Evaluation: No calf swelling or tenderness Extremities: minimal edema Skin: warm, dry; incision clean/dry/intact w/ honeycomb dressing in place, small amount of saturation on inferior bandage noted  Recent Labs    09/09/20 1756 09/10/20 0540  HGB 11.1* 10.5*  HCT 33.0* 31.0*    Assessment/Plan: Jill Arias is a 25 y.o. 21 s/p primary c/s at [redacted]w[redacted]d for severe preeclampsia.  POD#3 -  Pt is doing well BP much improved and is on no meds Anticipate d/c in AM  LOS: 4 days   [redacted]w[redacted]d, Md Faculty Attending, [facility] 09/11/2020, 12:12 PM

## 2020-09-12 ENCOUNTER — Ambulatory Visit: Payer: Self-pay

## 2020-09-12 DIAGNOSIS — Z975 Presence of (intrauterine) contraceptive device: Secondary | ICD-10-CM

## 2020-09-12 DIAGNOSIS — Z30017 Encounter for initial prescription of implantable subdermal contraceptive: Secondary | ICD-10-CM

## 2020-09-12 MED ORDER — LIDOCAINE HCL 1 % IJ SOLN
0.0000 mL | Freq: Once | INTRAMUSCULAR | Status: AC | PRN
  Administered 2020-09-12: 20 mL via INTRADERMAL
  Filled 2020-09-12: qty 20

## 2020-09-12 MED ORDER — LIDOCAINE HCL 1 % IJ SOLN: 0.0000 mL | Freq: Once | INTRAMUSCULAR | Status: DC | PRN

## 2020-09-12 MED ORDER — ETONOGESTREL 68 MG ~~LOC~~ IMPL: 68.0000 mg | DRUG_IMPLANT | Freq: Once | SUBCUTANEOUS | Status: DC

## 2020-09-12 MED ORDER — IBUPROFEN 600 MG PO TABS: 600.0000 mg | ORAL_TABLET | Freq: Four times a day (QID) | ORAL | 2 refills | Status: AC | PRN

## 2020-09-12 MED ORDER — OXYCODONE HCL 5 MG PO TABS: 5.0000 mg | ORAL_TABLET | ORAL | 0 refills | Status: DC | PRN

## 2020-09-12 MED ORDER — AMLODIPINE BESYLATE 5 MG PO TABS: 5.0000 mg | ORAL_TABLET | Freq: Every day | ORAL | 1 refills | Status: DC

## 2020-09-12 MED ORDER — ETONOGESTREL 68 MG ~~LOC~~ IMPL
68.0000 mg | DRUG_IMPLANT | Freq: Once | SUBCUTANEOUS | Status: AC
  Administered 2020-09-12: 68 mg via SUBCUTANEOUS
  Filled 2020-09-12: qty 1

## 2020-09-12 NOTE — Discharge Instructions (Signed)
Etonogestrel implant What is this medicine? ETONOGESTREL (et oh noe JES trel) is a contraceptive (birth control) device. It is used to prevent pregnancy. It can be used for up to 3 years. This medicine may be used for other purposes; ask your health care provider or pharmacist if you have questions. COMMON BRAND NAME(S): Implanon, Nexplanon What should I tell my health care provider before I take this medicine? They need to know if you have any of these conditions:  abnormal vaginal bleeding  blood vessel disease or blood clots  breast, cervical, endometrial, ovarian, liver, or uterine cancer  diabetes  gallbladder disease  heart disease or recent heart attack  high blood pressure  high cholesterol or triglycerides  kidney disease  liver disease  migraine headaches  seizures  stroke  tobacco smoker  an unusual or allergic reaction to etonogestrel, anesthetics or antiseptics, other medicines, foods, dyes, or preservatives  pregnant or trying to get pregnant  breast-feeding How should I use this medicine? This device is inserted just under the skin on the inner side of your upper arm by a health care professional. Talk to your pediatrician regarding the use of this medicine in children. Special care may be needed. Overdosage: If you think you have taken too much of this medicine contact a poison control center or emergency room at once. NOTE: This medicine is only for you. Do not share this medicine with others. What if I miss a dose? This does not apply. What may interact with this medicine? Do not take this medicine with any of the following medications:  amprenavir  fosamprenavir This medicine may also interact with the following medications:  acitretin  aprepitant  armodafinil  bexarotene  bosentan  carbamazepine  certain medicines for fungal infections like fluconazole, ketoconazole, itraconazole and voriconazole  certain medicines to treat  hepatitis, HIV or AIDS  cyclosporine  felbamate  griseofulvin  lamotrigine  modafinil  oxcarbazepine  phenobarbital  phenytoin  primidone  rifabutin  rifampin  rifapentine  St. John's wort  topiramate This list may not describe all possible interactions. Give your health care provider a list of all the medicines, herbs, non-prescription drugs, or dietary supplements you use. Also tell them if you smoke, drink alcohol, or use illegal drugs. Some items may interact with your medicine. What should I watch for while using this medicine? This product does not protect you against HIV infection (AIDS) or other sexually transmitted diseases. You should be able to feel the implant by pressing your fingertips over the skin where it was inserted. Contact your doctor if you cannot feel the implant, and use a non-hormonal birth control method (such as condoms) until your doctor confirms that the implant is in place. Contact your doctor if you think that the implant may have broken or become bent while in your arm. You will receive a user card from your health care provider after the implant is inserted. The card is a record of the location of the implant in your upper arm and when it should be removed. Keep this card with your health records. What side effects may I notice from receiving this medicine? Side effects that you should report to your doctor or health care professional as soon as possible:  allergic reactions like skin rash, itching or hives, swelling of the face, lips, or tongue  breast lumps, breast tissue changes, or discharge  breathing problems  changes in emotions or moods  coughing up blood  if you feel that the implant   may have broken or bent while in your arm  high blood pressure  pain, irritation, swelling, or bruising at the insertion site  scar at site of insertion  signs of infection at the insertion site such as fever, and skin redness, pain or  discharge  signs and symptoms of a blood clot such as breathing problems; changes in vision; chest pain; severe, sudden headache; pain, swelling, warmth in the leg; trouble speaking; sudden numbness or weakness of the face, arm or leg  signs and symptoms of liver injury like dark yellow or brown urine; general ill feeling or flu-like symptoms; light-colored stools; loss of appetite; nausea; right upper belly pain; unusually weak or tired; yellowing of the eyes or skin  unusual vaginal bleeding, discharge Side effects that usually do not require medical attention (report to your doctor or health care professional if they continue or are bothersome):  acne  breast pain or tenderness  headache  irregular menstrual bleeding  nausea This list may not describe all possible side effects. Call your doctor for medical advice about side effects. You may report side effects to FDA at 1-800-FDA-1088. Where should I keep my medicine? This drug is given in a hospital or clinic and will not be stored at home. NOTE: This sheet is a summary. It may not cover all possible information. If you have questions about this medicine, talk to your doctor, pharmacist, or health care provider.  2021 Elsevier/Gold Standard (2019-04-29 11:33:04) Postpartum Hypertension Postpartum hypertension is high blood pressure that is higher than normal after childbirth. It usually starts within 1 to 2 days after delivery, but it can happen at any time for up to 6 weeks after delivery. For some women, medical treatment is required to prevent serious complications, such as seizures or stroke. What are the causes? The cause of this condition is not well understood. In some cases, the cause may not be known. Certain conditions may increase your risk. These include:  Hypertension that existed before pregnancy (chronic hypertension).  Hypertension that comes as a result of pregnancy (gestational hypertension).  Hypertensive  disorders during pregnancy or seizures in women who have high blood pressure during pregnancy. These conditions are called preeclampsia and eclampsia.  A condition in which the liver, platelets, and red blood cells are damaged during pregnancy (HELLP syndrome).  Obesity.  Diabetes. What are the signs or symptoms? As with all types of hypertension, postpartum hypertension may not have any symptoms. Depending on how high your blood pressure is, you may experience:  Headaches. These may be mild, moderate, or severe. They may also be steady, constant, or sudden in onset (thunderclap headache).  Vision changes, such as blurry vision, flashing lights, or seeing spots.  Nausea and vomiting.  Pain in the upper right side of your abdomen.  Shortness of breath.  Difficulty breathing while lying down.  A decrease in the amount of urine that you pass. How is this diagnosed? This condition may be diagnosed based on the results of a physical exam, blood pressure measurements, and blood and urine tests. You may also have other tests, such as a CT scan or an MRI, to check for other problems of postpartum hypertension. How is this treated? If blood pressure is high enough to require treatment, your options may include:  Medicines to reduce blood pressure (antihypertensives). Tell your health care provider if you are breastfeeding or if you plan to breastfeed. There are many antihypertensive medicines that are safe to take while breastfeeding.  Treating medical  conditions that are causing hypertension.  Treating the complications of hypertension, such as seizures, stroke, or kidney problems. Your health care provider will also continue to monitor your blood pressure closely until it is within a safe range for you. Follow these instructions at home: Learn your goal blood pressure Two numbers make up your blood pressure. The first number is called systolic pressure. The second is called diastolic  pressure. An example of a blood pressure reading is "120 over 80" (or 120/80). For most people, goal blood pressure is:  First number: below 140.  Second number: below 90. Your blood pressure is above normal even if only the top or bottom number is above normal. Know what to do before you take your blood pressure 30 minutes before you check your blood pressure:  Do not drink caffeine.  Do not drink alcohol.  Avoid food and drink.  Do not smoke.  Do not exercise. 5 minutes before you check your blood pressure:  Use the bathroom and urinate so that you have an empty bladder.  Sit quietly in a dining room chair. Do not sit in a soft couch or an armchair. Do not talk. Know how to take your blood pressure To check your blood pressure, follow the instructions in the manual that came with your blood pressure monitor. If you have a digital blood pressure monitor, the instructions may be as follows: 1. Sit up straight. 2. Place your feet on the floor. Do not cross your ankles or legs. 3. Rest your left arm at the level of your heart. You may rest it on a table, desk, or chair. 4. Pull up your shirt sleeve. 5. Wrap the blood pressure cuff around the upper part of your left arm. The cuff should be 1 inch (2.5 cm) above your elbow. It is best to wrap the cuff around bare skin. 6. Fit the cuff snugly around your arm. You should be able to place only one finger between the cuff and your arm. 7. Put the cord inside the groove of your elbow. 8. Press the power button. 9. Sit quietly while the cuff fills with air and loses air. 10. Write down the numbers on the screen. These are your blood pressure readings. 11. Wait 1-2 minutes and then repeat steps 1-10.   Record your blood pressure readings Follow your health care provider's instructions on how to record your blood pressure readings. If you were asked to use this form, follow these instructions:  Get one reading in the morning (a.m.) before  you take any medicines.  Get one reading in the evening (p.m.) before supper.  Take at least 2 readings with each blood pressure check. This makes sure the results are correct. Wait 1-2 minutes between measurements.  Write down the results in the spaces on this form. Date: _______________________  a.m. _____________________(1st reading) _____________________(2nd reading)  p.m. _____________________(1st reading) _____________________(2nd reading) Date: _______________________  a.m. _____________________(1st reading) _____________________(2nd reading)  p.m. _____________________(1st reading) _____________________(2nd reading) Date: _______________________  a.m. _____________________(1st reading) _____________________(2nd reading)  p.m. _____________________(1st reading) _____________________(2nd reading) Date: _______________________  a.m. _____________________(1st reading) _____________________(2nd reading)  p.m. _____________________(1st reading) _____________________(2nd reading) Date: _______________________  a.m. _____________________(1st reading) _____________________(2nd reading)  p.m. _____________________(1st reading) _____________________(2nd reading) General instructions  Take over-the-counter and prescription medicines only as told by your health care provider.  Do not use any products that contain nicotine or tobacco. These products include cigarettes, chewing tobacco, and vaping devices, such as e-cigarettes. If you need help quitting, ask your health  care provider.  Check your blood pressure as often as recommended by your health care provider.  Return to your normal activities as told by your health care provider. Ask your health care provider what activities are safe for you.  Keep all follow-up visits. This is important. Contact a health care provider if:  You have new symptoms, such as: ? A headache that does not get better. ? Dizziness. ? Visual  changes. ? Nausea and vomiting. Get help right away if:  You develop difficulty breathing.  You have chest pain.  You faint.  You have any symptoms of a stroke. "BE FAST" is an easy way to remember the main warning signs of a stroke: ? B - Balance. Signs are dizziness, sudden trouble walking, or loss of balance. ? E - Eyes. Signs are trouble seeing or a sudden change in vision. ? F - Face. Signs are sudden weakness or numbness of the face, or the face or eyelid drooping on one side. ? A - Arms. Signs are weakness or numbness in an arm. This happens suddenly and usually on one side of the body. ? S - Speech. Signs are sudden trouble speaking, slurred speech, or trouble understanding what people say. ? T - Time. Time to call emergency services. Write down what time symptoms started.  You have other signs of a stroke, such as: ? A sudden, severe headache with no known cause. ? Nausea or vomiting. ? Seizure. These symptoms may represent a serious problem that is an emergency. Do not wait to see if the symptoms will go away. Get medical help right away. Call your local emergency services (911 in the [place] Do not drive yourself to the hospital. Summary  Postpartum hypertension is high blood pressure that remains higher than normal after childbirth.  For some women, medical treatment is required to prevent serious complications, such as seizures or stroke.  Follow your health care provider's instructions on how to record your blood pressure readings.  Keep all follow-up visits. This is important. This information is not intended to replace advice given to you by your health care provider. Make sure you discuss any questions you have with your health care provider. Document Revised: 04/13/2020 Document Reviewed: 04/13/2020 Elsevier Patient Education  2021 [employer]. Postpartum Care After Cesarean Delivery This sheet gives you information about how to care for yourself from the time  you deliver your baby to up to 6-12 weeks after delivery (postpartum period). Your health care provider may also give you more specific instructions. If you have problems or questions, contact your health care provider. Follow these instructions at home: Medicines  Take over-the-counter and prescription medicines only as told by your health care provider.  If you were prescribed an antibiotic medicine, take it as told by your health care provider. Do not stop taking the antibiotic even if you start to feel better.  Ask your health care provider if the medicine prescribed to you: ? Requires you to avoid driving or using heavy machinery. ? Can cause constipation. You may need to take actions to prevent or treat constipation, such as:  Drink enough fluid to keep your urine pale yellow.  Take over-the-counter or prescription medicines.  Eat foods that are high in fiber, such as beans, whole grains, and fresh fruits and vegetables.  Limit foods that are high in fat and processed sugars, such as fried or sweet foods. Activity  Gradually return to your normal activities as told by your health care provider.  Avoid activities that take a lot of effort and energy (are strenuous) until approved by your health care provider. Walking at a slow to moderate pace is usually safe. Ask your health care provider what activities are safe for you. ? Do not lift anything that is heavier than your baby or 10 lb (4.5 kg) as told by your health care provider. ? Do not vacuum, climb stairs, or drive a car for as long as told by your health care provider.  If possible, have someone help you at home until you are able to do your usual activities yourself.  Rest as much as possible. Try to rest or take naps while your baby is sleeping. Vaginal bleeding  It is normal to have vaginal bleeding (lochia) after delivery. Wear a sanitary pad to absorb vaginal bleeding and discharge. ? During the first week after  delivery, the amount and appearance of lochia is often similar to a menstrual period. ? Over the next few weeks, it will gradually decrease to a dry, yellow-brown discharge. ? For most women, lochia stops completely by 4-6 weeks after delivery. Vaginal bleeding can vary from woman to woman.  Change your sanitary pads frequently. Watch for any changes in your flow, such as: ? A sudden increase in volume. ? A change in color. ? Large blood clots.  If you pass a blood clot, save it and call your health care provider to discuss. Do not flush blood clots down the toilet before you get instructions from your health care provider.  Do not use tampons or douches until your health care provider says this is safe.  If you are not breastfeeding, your period should return 6-8 weeks after delivery. If you are breastfeeding, your period may return anytime between 8 weeks after delivery and the time that you stop breastfeeding. Perineal care  If your C-section (Cesarean section) was unplanned, and you were allowed to labor and push before delivery, you may have pain, swelling, and discomfort of the tissue between your vaginal opening and your anus (perineum). You may also have an incision in the tissue (episiotomy) or the tissue may have torn during delivery. Follow these instructions as told by your health care provider: ? Keep your perineum clean and dry as told by your health care provider. Use medicated pads and pain-relieving sprays and creams as directed. ? If you have an episiotomy or vaginal tear, check the area every day for signs of infection. Check for:  Redness, swelling, or pain.  Fluid or blood.  Warmth.  Pus or a bad smell. ? You may be given a squirt bottle to use instead of wiping to clean the perineum area after you go to the bathroom. As you start healing, you may use the squirt bottle before wiping yourself. Make sure to wipe gently. ? To relieve pain caused by an episiotomy, vaginal  tear, or hemorrhoids, try taking a warm sitz bath 2-3 times a day. A sitz bath is a warm water bath that is taken while you are sitting down. The water should only come up to your hips and should cover your buttocks.   Breast care  Within the first few days after delivery, your breasts may feel heavy, full, and uncomfortable (breast engorgement). You may also have milk leaking from your breasts. Your health care provider can suggest ways to help relieve breast discomfort. Breast engorgement should go away within a few days.  If you are breastfeeding: ? Wear a bra that supports your breasts  and fits you well. ? Keep your nipples clean and dry. Apply creams and ointments as told by your health care provider. ? You may need to use breast pads to absorb milk leakage. ? You may have uterine contractions every time you breastfeed for several weeks after delivery. Uterine contractions help your uterus return to its normal size. ? If you have any problems with breastfeeding, work with your health care provider or a Advertising copywriter.  If you are not breastfeeding: ? Avoid touching your breasts as this can make your breasts produce more milk. ? Wear a well-fitting bra and use cold packs to help with swelling. ? Do not squeeze out (express) milk. This causes you to make more milk. Intimacy and sexuality  Ask your health care provider when you can engage in sexual activity. This may depend on your: ? Risk of infection. ? Healing rate. ? Comfort and desire to engage in sexual activity.  You are able to get pregnant after delivery, even if you have not had your period. If desired, talk with your health care provider about methods of family planning or birth control (contraception). Lifestyle  Do not use any products that contain nicotine or tobacco, such as cigarettes, e-cigarettes, and chewing tobacco. If you need help quitting, ask your health care provider.  Do not drink alcohol, especially if  you are breastfeeding. Eating and drinking  Drink enough fluid to keep your urine pale yellow.  Eat high-fiber foods every day. These may help prevent or relieve constipation. High-fiber foods include: ? Whole grain cereals and breads. ? Brown rice. ? Beans. ? Fresh fruits and vegetables.  Take your prenatal vitamins until your postpartum checkup or until your health care provider tells you it is okay to stop.   General instructions  Keep all follow-up visits for you and your baby as told by your health care provider. Most women visit their health care provider for a postpartum checkup within the first 3-6 weeks after delivery. Contact a health care provider if you:  Feel unable to cope with the changes that a new baby brings to your life, and these feelings do not go away.  Feel unusually sad or worried.  Have breasts that are painful, hard, or turn red.  Have a fever.  Have trouble holding urine or keeping urine from leaking.  Have little or no interest in activities you used to enjoy.  Have not breastfed at all and you have not had a menstrual period for 12 weeks after delivery.  Have stopped breastfeeding and you have not had a menstrual period for 12 weeks after you stopped breastfeeding.  Have questions about caring for yourself or your baby.  Pass a blood clot from your vagina. Get help right away if you:  Have chest pain.  Have difficulty breathing.  Have sudden, severe leg pain.  Have severe pain or cramping in your abdomen.  Bleed from your vagina so much that you fill more than one sanitary pad in one hour. Bleeding should not be heavier than your heaviest period.  Develop a severe headache.  Faint.  Have blurred vision or spots in your vision.  Have a bad-smelling vaginal discharge.  Have thoughts about hurting yourself or your baby. If you ever feel like you may hurt yourself or others, or have thoughts about taking your own life, get help right  away. You can go to your nearest emergency department or call:  Your local emergency services (911 in the [place]  A suicide crisis helpline, such as the National Suicide Prevention Lifeline at 334 267 7695. This is open 24 hours a day. Summary  The period of time from when you deliver your baby to up to 6-12 weeks after delivery is called the postpartum period.  Gradually return to your normal activities as told by your health care provider.  Keep all follow-up visits for you and your baby as told by your health care provider. This information is not intended to replace advice given to you by your health care provider. Make sure you discuss any questions you have with your health care provider. Document Revised: 03/06/2018 Document Reviewed: 03/06/2018 Elsevier Patient Education  2021 [employer].

## 2020-09-12 NOTE — Procedures (Addendum)
Post-Placental Nexplanon Insertion Procedure Note  Patient was identified. Informed consent was signed, signed copy in chart. A time-out was performed.    The insertion site was identified 8-10 cm (3-4 inches) from the medial epicondyle of the humerus and 3-5 cm (1.25-2 inches) posterior to (below) the sulcus (groove) between the biceps and triceps muscles of the patient's L arm and marked. The site was prepped and draped in the usual sterile fashion. Pt was prepped with alcohol swab and then injected with 10 cc of 1% lidocaine. The site was prepped with betadine. Nexplanon removed form packaging,  Device confirmed in needle, then inserted full length of needle and withdrawn per handbook instructions. Provider and patient verified presence of the implant in the woman's arm by palpation. Pt insertion site was covered with gauze and pressure bandage. There was minimal blood loss. Patient tolerated procedure well.  Patient was given post procedure instructions and Nexplanon user card with expiration date. Condoms were recommended for STI prevention. Patient was asked to keep the pressure dressing on for 24 hours to minimize bruising and keep the gauze on for 3-5 days. The patient verbalized understanding of the plan of care and agrees.   Lot # [id number] Expiration Date 12/10/22  GME ATTESTATION:  I saw and evaluated the patient. I agree with the findings and the plan of care as documented in the resident's note. I was gloved for the entirety of the procedure.  Alric Seton, MD OB Fellow, Faculty [facility], [facility] 09/12/2020 10:44 AM

## 2020-09-12 NOTE — Lactation Note (Signed)
This note was copied from a baby's chart. Lactation Consultation Note  Patient Name: Jill Arias TJQZE'S Date: 09/12/2020 Reason for consult: NICU baby Age:23 hours  Follow up visit to 47 hours old infant. Mother was discharge today and needs a loaner pump. Mother is [facility] participant. Mother states she will picking up a loaner pump from [employer] as soon as she is contacted.  Pump #  [id number] $30 deposit provided in cash as required.  Product to be returned by .  Mother has been using initiation setting and collecting ~48mL of EBM. Talked about transitioning to maintenance setting and explained length of pumping session. Discussed that pumping session time will always vary and it will give her stimulation similar to infant feeding from breast. Encouraged to pump for two more minutes when noticing drops after letdown. Mother verbalizes in agreement.  Encouraged to contact Lactation Services for any needs, support or questions. Praised mother for milk supply, effort and dedication.    Feeding Mother's Current Feeding Choice: Breast Milk and Formula  Lactation Tools Discussed/Used Tools: Pump Breast pump type: Double-Electric Breast Pump Pump Education: Setup, frequency, and cleaning Reason for Pumping: maternal infant separation Pumping frequency: 8-12 times in 24h Pumped volume: 20 mL (last pumping session during this am)  Interventions Interventions: DEBP;Expressed milk;Education  Discharge Pump: [facility] Loaner WIC Program: Yes  Consult Status Consult Status: Follow-up Date: 09/13/20 Follow-up type: In-patient    Ishmeal Rorie A Higuera Ancidey 09/12/2020, 12:53 PM

## 2020-09-12 NOTE — Progress Notes (Signed)
POSTPARTUM PROGRESS NOTE  POD #3  Subjective:  Francille Wittmann is a 25 y.o. [phone] s/p primary LTCS at [redacted]w[redacted]d.  She reports she doing well. No acute events overnight. She denies any problems with ambulating, voiding or po intake. Denies nausea or vomiting. She has  passed flatus. Pain is well controlled.  Lochia is minimal.  Objective: Blood pressure 120/70, pulse 71, temperature 98.9 F (37.2 C), temperature source Oral, resp. rate 18, height 5\' 8"  (1.727 m), weight 62.5 kg, last menstrual period 01/23/2020, SpO2 98 %, unknown if currently breastfeeding.  Physical Exam:  General: alert, cooperative and no distress Chest: no respiratory distress Heart:regular rate, distal pulses intact Abdomen: soft, nontender, ND, positive bowel sounds Uterine Fundus: firm, appropriately tender DVT Evaluation: No calf swelling or tenderness Extremities: minimal edema Skin: warm, dry; incision clean/dry/intact w/ honeycomb dressing in place  Recent Labs    09/09/20 1756 09/10/20 0540  HGB 11.1* 10.5*  HCT 33.0* 31.0*    Assessment/Plan: Markita Stcharles is a 25 y.o. 21 s/p LTCS at [redacted]w[redacted]d for severe preeclampsia.  POD#3 -  D/C home today, rx for norvasc at time of discharge  LOS: 5 days   [redacted]w[redacted]d, Md Faculty Attending, Center for [facility] 09/12/2020, 8:24 AM

## 2020-09-12 NOTE — Discharge Summary (Signed)
Postpartum Discharge Summary  Date of Service updated 09/12/20     Patient Name: Jill Arias DOB: [birth year] MRN: [id number]  Date of admission: 09/07/2020 Delivery date:09/09/2020  Delivering provider: Aletha Halim  Date of discharge: 09/12/2020  Admitting diagnosis: Pre-eclampsia [O14.90] Intrauterine pregnancy: [redacted]w[redacted]d    Secondary diagnosis:  Principal Problem:   Severe preeclampsia, third trimester Active Problems:   Marginal insertion of umbilical cord affecting management of mother   [redacted] weeks gestation of pregnancy   Poor fetal growth affecting management of mother in third trimester  Additional problems: marginal cord insertion    Discharge diagnosis: Preeclampsia (severe)                                              Post partum procedures:magnesium sulfate Augmentation: N/A Complications: None  Hospital course: Pt was admitted on 09/07/20 with severe preeclampsia and IUGR.  During her stay pt developed an unremitting headache.  Decision was made for delivery, and it was thought the fetus would not tolerate labor so a primary cesarean section was performed.  Please see operative note.  Pt received 24 hours of seizure prophylaxis with magnesium sulfate.  POD 2-3 were uncomplicated.  Pt received nexplanon prior to discharge.  Magnesium Sulfate received: Yes: Seizure prophylaxis BMZ received: No Rhophylac:Yes MMR:No T-DaP:unknown Flu: No Transfusion:No  Physical exam  Vitals:   09/11/20 2040 09/11/20 2300 09/12/20 0412 09/12/20 0817  BP: 127/62 123/63 130/75 120/70  Pulse: 69 73 76 71  Resp: _0 Temp: 99 F (37.2 C) 98.9 F (37.2 C) 98.9 F (37.2 C) 98.9 F (37.2 C)  TempSrc: Oral Oral Oral Oral  SpO2: 100% 100% 100% 98%  Weight:      Height:       General: alert, cooperative and no distress Lochia: appropriate Uterine Fundus: firm Incision: Healing well with no significant drainage, No significant erythema, Dressing is clean, dry, and  intact DVT Evaluation: No evidence of DVT seen on physical exam. No cords or calf tenderness. Labs: Lab Results  Component Value Date   WBC 18.2 (H) 09/10/2020   HGB 10.5 (L) 09/10/2020   HCT 31.0 (L) 09/10/2020   MCV 89.6 09/10/2020   PLT 133 (L) 09/10/2020   CMP Latest Ref Rng & Units 09/10/2020  Glucose 70 - 99 mg/dL 105(H)  BUN 6 - 20 mg/dL 15  Creatinine 0.44 - 1.00 mg/dL 0.91  Sodium 135 - 145 mmol/L 133(L)  Potassium 3.5 - 5.1 mmol/L 5.0  Chloride 98 - 111 mmol/L 102  CO2 22 - 32 mmol/L 22  Calcium 8.9 - 10.3 mg/dL 8.0(L)  Total Protein 6.5 - 8.1 g/dL 5.2(L)  Total Bilirubin 0.3 - 1.2 mg/dL 0.5  Alkaline Phos 38 - 126 U/L 135(H)  AST 15 - 41 U/L 34  ALT 0 - 44 U/L 23   Edinburgh Score: Edinburgh Postnatal Depression Scale Screening Tool 09/11/2020  I have been able to laugh and see the funny side of things. 0  I have looked forward with enjoyment to things. 0  I have blamed myself unnecessarily when things went wrong. 0  I have been anxious or worried for no good reason. 1  I have felt scared or panicky for no good reason. 0  Things have been getting on top of me. 0  I have been so unhappy that I have  had difficulty sleeping. 0  I have felt sad or miserable. 0  I have been so unhappy that I have been crying. 0  The thought of harming myself has occurred to me. 0  Edinburgh Postnatal Depression Scale Total 1     After visit meds:  Allergies as of 09/12/2020   No Known Allergies     Medication List    TAKE these medications   acetaminophen 500 MG tablet Commonly known as: TYLENOL Take 500-1,000 mg by mouth every 6 (six) hours as needed for mild pain or headache.   amLODipine 5 MG tablet Commonly known as: Norvasc Take 1 tablet (5 mg total) by mouth daily.   Blood Pressure Monitoring Devi 1 each by Does not apply route once a week.   ibuprofen 600 MG tablet Commonly known as: ADVIL Take 1 tablet (600 mg total) by mouth every 6 (six) hours as needed for  fever or headache.   oxyCODONE 5 MG immediate release tablet Commonly known as: Oxy IR/ROXICODONE Take 1-2 tablets (5-10 mg total) by mouth every 4 (four) hours as needed for moderate pain.   Vitafol Gummies 3.33-0.333-34.8 MG Chew Chew 3 each by mouth daily.   vitamin C 250 MG tablet Commonly known as: ASCORBIC ACID Take 500 mg by mouth daily.        Discharge home in stable condition Infant Feeding: mother pumping breast milk Infant Disposition:NICU Discharge instruction: per After Visit Summary and Postpartum booklet. Activity: Advance as tolerated. Pelvic rest for 6 weeks.  Diet: routine diet Future Appointments: Future Appointments  Date Time Provider [place]  09/14/2020  2:55 PM Clarnce Flock, MD [facility]   Follow up Visit:  [place] [facility] Follow up in 4 week(s).   Specialty: Obstetrics and Gynecology Why: postpartum care, needs blood pressure check in 1 week Contact information: [place] [id number] [phone]               Please schedule this patient for a In person postpartum visit in 4 weeks with the following provider: MD. Additional Postpartum F/U:BP check 1 week  High risk pregnancy complicated by: severe preeclampsia Delivery mode:  C-Section, Low Transverse  Anticipated Birth Control:  nexplanon to be placed before discharge   09/12/2020 Griffin Basil, MD

## 2020-09-13 ENCOUNTER — Telehealth: Payer: Self-pay

## 2020-09-13 LAB — SURGICAL PATHOLOGY

## 2020-09-13 NOTE — Telephone Encounter (Signed)
Transition Care Management Unsuccessful Follow-up Telephone Call  Date of discharge and from where:  09/12/20 from [facility]  Attempts:  1st Attempt  Reason for unsuccessful TCM follow-up call:  Left voice message

## 2020-09-14 ENCOUNTER — Encounter: Payer: Medicaid Other | Admitting: Family Medicine

## 2020-09-14 ENCOUNTER — Ambulatory Visit: Payer: Self-pay

## 2020-09-14 NOTE — Telephone Encounter (Signed)
Pt called back.   Transition Care Management Follow-up Telephone Call  Date of discharge and from where: 09/12/2020 from [facility]  How have you been since you were released from the hospital? Pt states that she is feeling okay today. She has some soreness where the staples are but overall is feeling well.   Any questions or concerns? No  Items Reviewed:  Did the pt receive and understand the discharge instructions provided? Yes   Medications obtained and verified? Yes   Other? No   Any new allergies since your discharge? No   Dietary orders reviewed? N/A  Do you have support at home? Yes   Functional Questionnaire: (I = Independent and D = Dependent) ADLs: I  Bathing/Dressing- I  Meal Prep- I  Eating- I  Maintaining continence- I  Transferring/Ambulation- I  Managing Meds- I   Follow up appointments reviewed:   Specialist Hospital f/u appt confirmed? No  Pt to call OBGYN office for a 1 week follow up appointment.   Are transportation arrangements needed? No   If their condition worsens, is the pt aware to call PCP or go to the Emergency Dept.? Yes  Was the patient provided with contact information for the PCP's office or ED? Yes  Was to pt encouraged to call back with questions or concerns? Yes

## 2020-09-14 NOTE — Telephone Encounter (Signed)
Transition Care Management Unsuccessful Follow-up Telephone Call  Date of discharge and from where:  09/12/20 from [facility] Attempts:  2nd Attempt  Reason for unsuccessful TCM follow-up call:  Left voice message

## 2020-09-14 NOTE — Lactation Note (Signed)
This note was copied from a baby's chart. Lactation Consultation Note  Patient Name: Jill Arias DVVOH'Y Date: 09/14/2020   Age:23 days  MAU staff member Maryann left a voicemail in [facility] line stating that mother came to return [facility] loaner but that nobody was there and she couldn't get the envelope with her deposit back. LC communicated with lactation director and was told that mom needs to come back during business hours between 8 am-4 pm. [facility] NICU staff will reach out to mom tomorrow to explain pump return process.    Maternal Data    Feeding    LATCH Score                    Lactation Tools Discussed/Used    Interventions    Discharge    Consult Status      Nobuko Gsell Venetia Constable 09/14/2020, 9:40 PM

## 2020-09-14 NOTE — Lactation Note (Signed)
This note was copied from a baby's chart. Lactation Consultation Note  Patient Name: Jill Arias Today's Date: 09/14/2020 Reason for consult: Follow-up assessment;Mother's request;1st time breastfeeding;Primapara;NICU baby;Preterm <34wks Age:23 days  1458 - 1508 - Ms. Crossland requested lactation follow up. She has a personal used pump at home. She is returning her WIC loaner pump. She has [place] WIC, but she has not heard from them regarding a WIC pump. We discussed importance of a hospital grade pump for establishing milk supply.   Ms. Echeverria's support person was holding baby STS. He had some questions about baby's readiness for discharge. Some education on developmental progress discussed.  I recommended that Ms. Arnone bring her DEBP kit to the hospital to pump while visiting with baby.   I left a voicemail with [place] WIC after this visit.  We discussed best practices for establishing and maintaining milk volume.   Maternal Data Does the patient have breastfeeding experience prior to this delivery?: No    Lactation Tools Discussed/Used Breast pump type: Double-Electric Breast Pump Pump Education: Setup, frequency, and cleaning Reason for Pumping: NICU Pumping frequency: 8 Pumped volume: 90 mL  Interventions    Discharge Pump: DEBP  Consult Status Consult Status: Follow-up Date: 09/15/20 Follow-up type: In-patient    Heather Lynn 09/14/2020, 3:41 PM    

## 2020-10-18 ENCOUNTER — Encounter: Payer: Self-pay | Admitting: Nurse Practitioner

## 2020-10-18 ENCOUNTER — Other Ambulatory Visit: Payer: Self-pay

## 2020-10-18 ENCOUNTER — Ambulatory Visit (INDEPENDENT_AMBULATORY_CARE_PROVIDER_SITE_OTHER): Payer: Medicaid Other | Admitting: Nurse Practitioner

## 2020-10-18 DIAGNOSIS — Z3046 Encounter for surveillance of implantable subdermal contraceptive: Secondary | ICD-10-CM | POA: Diagnosis not present

## 2020-10-18 DIAGNOSIS — Z8759 Personal history of other complications of pregnancy, childbirth and the puerperium: Secondary | ICD-10-CM

## 2020-10-18 DIAGNOSIS — O34219 Maternal care for unspecified type scar from previous cesarean delivery: Secondary | ICD-10-CM | POA: Insufficient documentation

## 2020-10-18 NOTE — Progress Notes (Signed)
Post Partum Visit Note  Jill Arias is a 25 y.o. [phone] female who presents for a postpartum visit. She is 5 weeks postpartum following a primary cesarean section.  I have fully reviewed the prenatal and intrapartum course. The delivery was at [redacted]w[redacted]d gestational weeks due to severe pre-eclampsia with MGSO4 given.  Anesthesia: spinal. Postpartum course has been good. Baby is doing well at home now after staying in NICU. Baby is feeding by bottle - Similac Neosure. Bleeding staining only. Bowel function is normal. Bladder function is normal. Patient is not sexually active. Contraception method is Nexplanon inserted inpatient. Postpartum depression screening: negative.   The pregnancy intention screening data noted above was reviewed. Potential methods of contraception were discussed. The patient elected to proceed with Hormonal Implant.    Edinburgh Postnatal Depression Scale - 10/18/20 1350      Edinburgh Postnatal Depression Scale:  In the Past 7 Days   I have been able to laugh and see the funny side of things. 0    I have looked forward with enjoyment to things. 0    I have blamed myself unnecessarily when things went wrong. 0    I have been anxious or worried for no good reason. 0    I have felt scared or panicky for no good reason. 0    Things have been getting on top of me. 0    I have been so unhappy that I have had difficulty sleeping. 0    I have felt sad or miserable. 0    I have been so unhappy that I have been crying. 0    The thought of harming myself has occurred to me. 0    Edinburgh Postnatal Depression Scale Total 0            The following portions of the patient's history were reviewed and updated as appropriate: allergies, current medications, past family history, past medical history, past social history, past surgical history and problem list.  Review of Systems Pertinent items noted in HPI and remainder of comprehensive ROS otherwise negative.     Objective:  BP 121/79   Pulse 83   Ht 5\' 8"  (1.727 m)   Wt 143 lb (64.9 kg)   LMP 01/23/2020   Breastfeeding No   BMI 21.74 kg/m    General:  alert, cooperative and no distress   Breasts:  deferred  Lungs: clear to auscultation bilaterally  Heart:  regular rate and rhythm, S1, S2 normal, no murmur, click, rub or gallop  Abdomen: soft, nontender, incision is well healed   Vulva: Pelvic deferred  Vagina:   Cervix:    Corpus:   Adnexa:    Rectal Exam:         Assessment:    Normal postpartum exam. Pap smear not done at today's visit.  BP is normal off medication for past 2 weeks.  Plan:   Essential components of care per ACOG recommendations:  1.  Mood and well being: Patient with negative depression screening today. Reviewed local resources for support.  - Patient does not use tobacco. - hx of drug use? No    2. Infant care and feeding:  -Patient currently breastmilk feeding? No -Social determinants of health (SDOH) reviewed in EPIC. No concerns  3. Sexuality, contraception and birth spacing - Patient does not want a pregnancy in the next year.   - Discussed birth spacing of 18 months Advised to call the office if irregular bleeding is bothersome.  Expect bleeding pattern to lessen after 6 months.  4. Sleep and fatigue -Encouraged family/partner/community support of 4 hrs of uninterrupted sleep to help with mood and fatigue  5. Physical Recovery  - Discussed patients delivery and complications - Patient has urinary incontinence? No* - Patient is safe to resume physical and sexual activity  6.  Health Maintenance - Last pap smear done 08-06-19 and was normal   7. Chronic Disease -  Advised that BP medication can be stopped due to normal BP today Advised she is at risk of early cardiac problems due to severe preeclampsia resulting in preterm birth.  Advised to keep weight under BMI of 25.  Continue to be a nonsmoker, begin exercise gently and increase, and  watch BP periodically.  Currie Paris, NP Center for [employer]

## 2021-02-03 IMAGING — US US MFM OB FOLLOW-UP
1 series · 14 of 28 positions shown · non-contrast
Comparison: none

[Series 1: us mfm ob follow-up · 70 acquisitions, 14 frames shown]
[im 3/70]
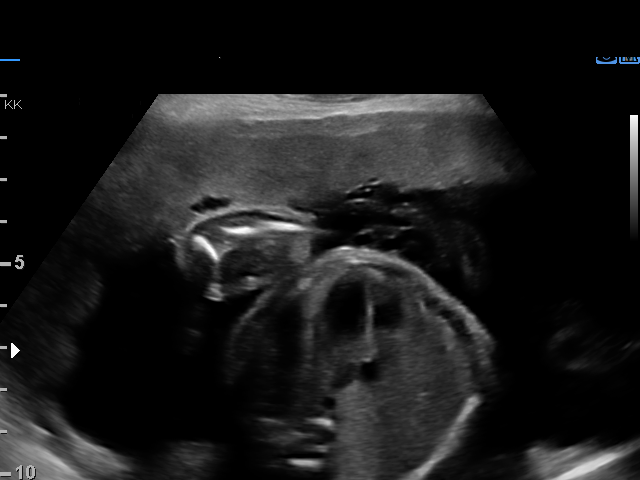
[im 8/70]
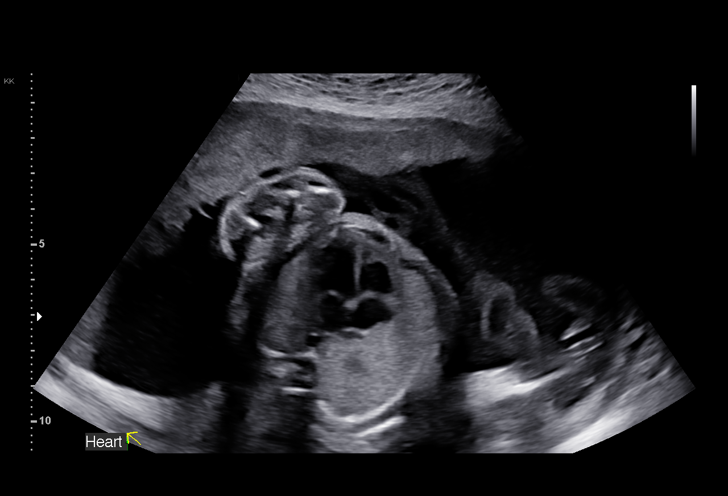
[im 13/70]
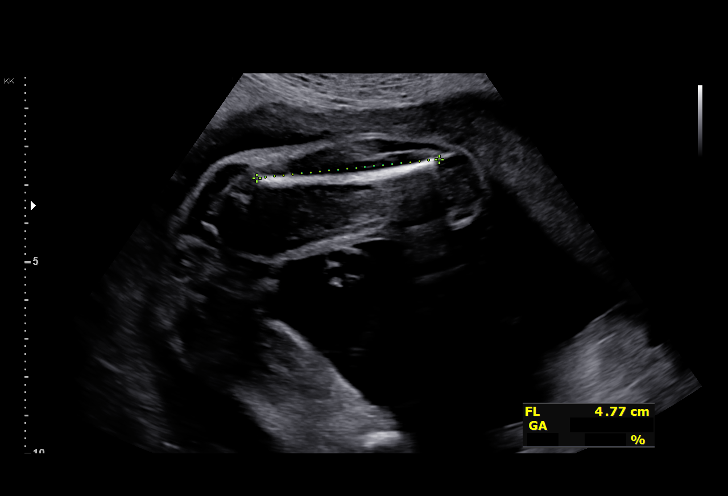
[im 18/70]
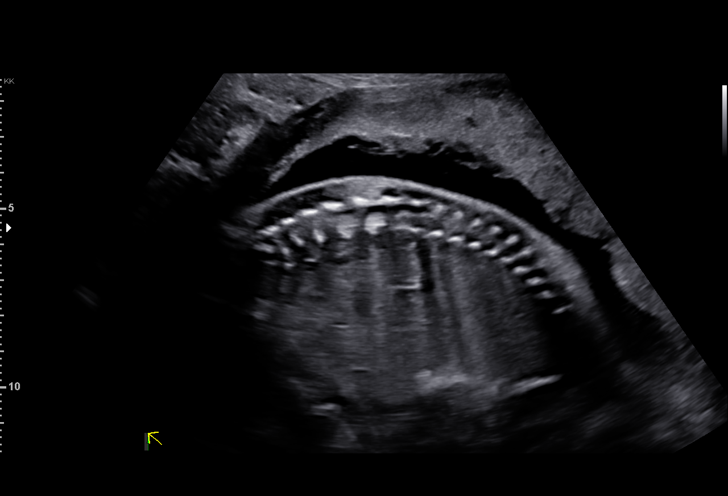
[im 24/70]
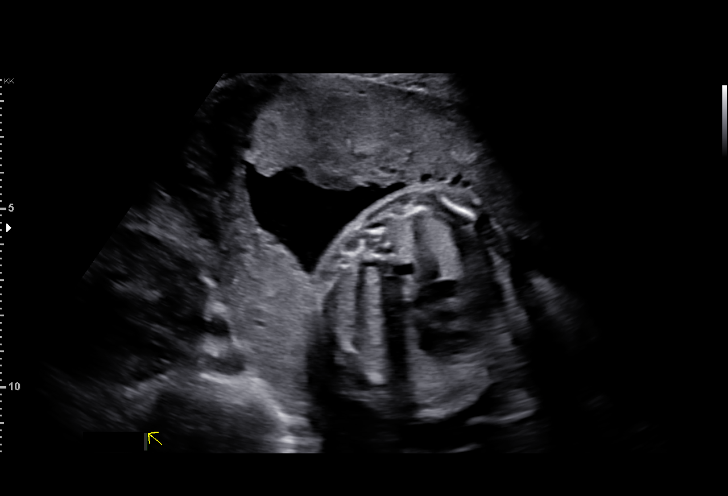
[im 29/70]
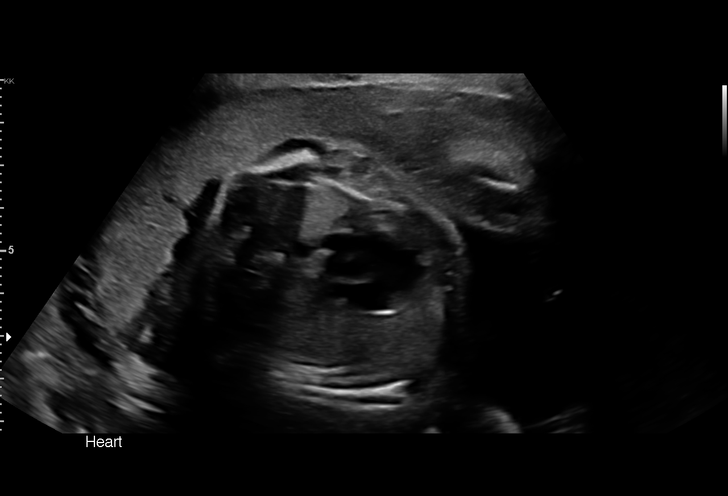
[im 34/70]
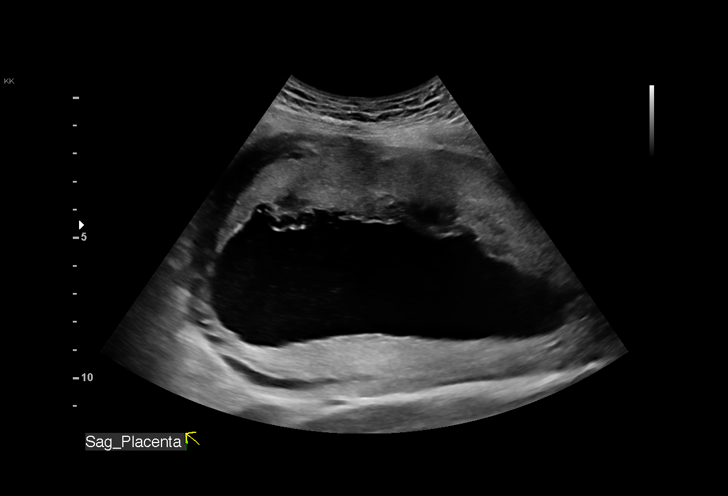
[im 39/70]
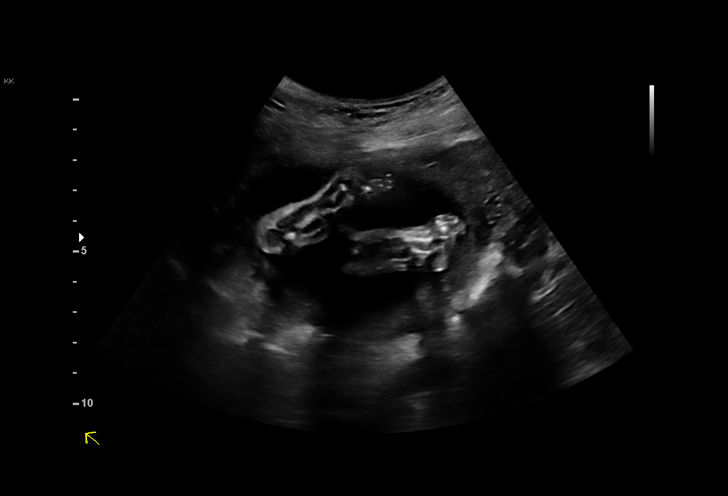
[im 44/70]
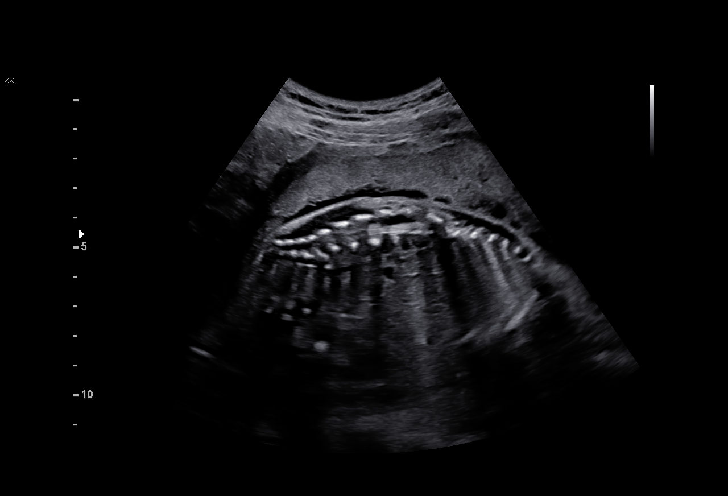
[im 49/70]
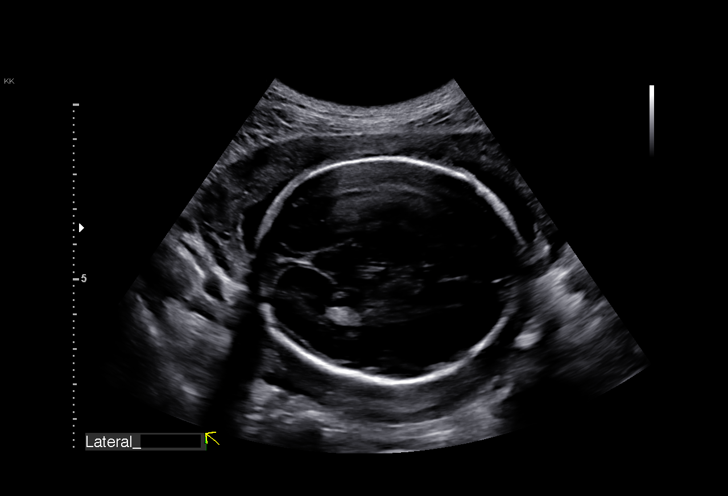
[im 54/70]
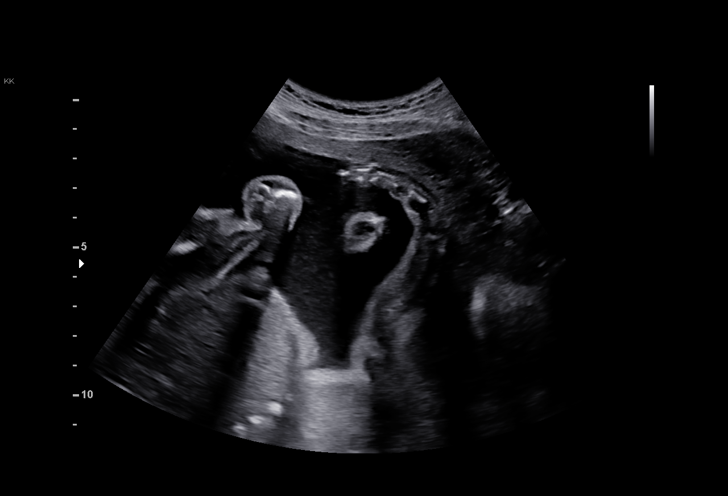
[im 59/70]
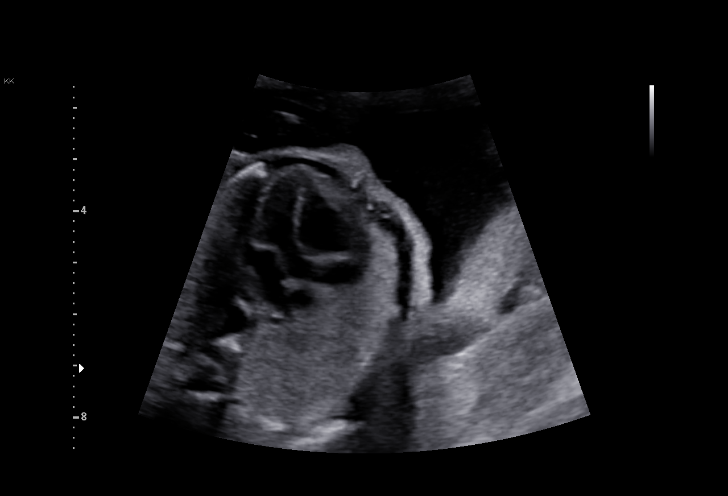
[im 64/70]
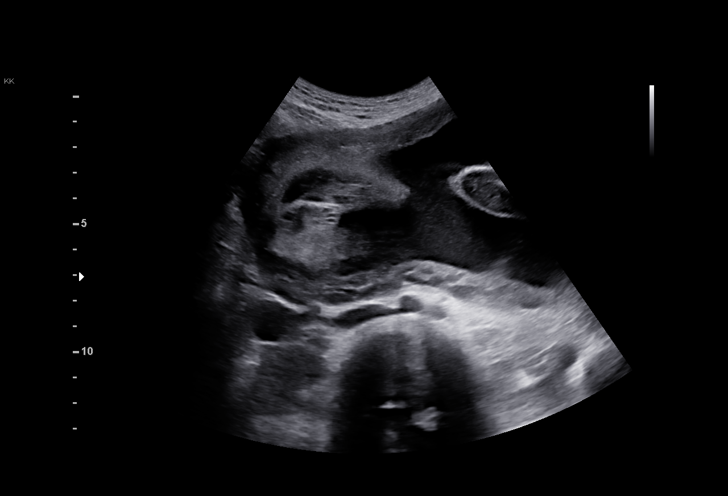
[im 70/70]
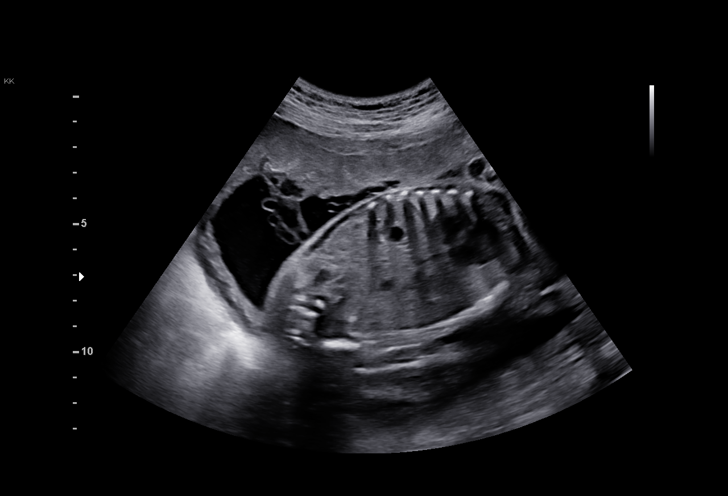

[14 of 28 positions shown; findings below may reference images not displayed]

TIGER NP

Indications

 Antenatal follow-up for nonvisualized fetal
 anatomy
 Negative AFP(Negative Horizon)(Low Risk
 NIPS)
 25 weeks gestation of pregnancy
Fetal Evaluation

 Num Of Fetuses:         1
 Fetal Heart Rate(bpm):  152
 Cardiac Activity:       Observed
 Presentation:           Cephalic
 Placenta:               Fundal
 P. Cord Insertion:      Marginal insertion

 Amniotic Fluid
 AFI FV:      Within normal limits

                             Largest Pocket(cm)

Biometry

 BPD:      63.4  mm     G. Age:  25w 5d         44  %    CI:        76.65   %    70 - 86
                                                         FL/HC:      20.6   %    18.6 -
 HC:      229.4  mm     G. Age:  25w 0d         11  %    HC/AC:      1.09        1.04 -
 AC:      210.4  mm     G. Age:  25w 4d         41  %    FL/BPD:     74.4   %    71 - 87
 FL:       47.2  mm     G. Age:  25w 5d         41  %    FL/AC:      22.4   %    20 - 24

 Est. FW:     828  gm    1 lb 13 oz      40  %
OB History

 Gravidity:    3         Term:   2        Prem:   0        SAB:   0
 TOP:          0       Ectopic:  0        Living: 2
Gestational Age

 LMP:           25w 4d        Date:  01/23/20                 EDD:   10/29/20
 U/S Today:     25w 4d                                        EDD:   10/29/20
 Best:          25w 4d     Det. By:  LMP  (01/23/20)          EDD:   10/29/20
Anatomy

 Cranium:               Appears normal         Aortic Arch:            Previously seen
 Cavum:                 Appears normal         Ductal Arch:            Previously seen
 Ventricles:            Appears normal         Diaphragm:              Appears normal
 Choroid Plexus:        Previously seen        Stomach:                Appears normal, left
                                                                       sided
 Cerebellum:            Previously seen        Abdomen:                Appears normal
 Posterior Fossa:       Previously seen        Abdominal Wall:         Previously seen
 Nuchal Fold:           Previously seen        Cord Vessels:           Previously seen
 Face:                  Orbits and profile     Kidneys:                Appear normal
                        previously seen
 Lips:                  Previously seen        Bladder:                Appears normal
 Thoracic:              Appears normal         Spine:                  Appears normal
 Heart:                 Appears normal         Upper Extremities:      Previously seen
                        (4CH, axis, and
                        situs)
 RVOT:                  Previously seen        Lower Extremities:      Previously seen
 LVOT:                  Previously seen

 Other:  Fetus appears to be female.
Cervix Uterus Adnexa

 Cervix
 Not visualized (advanced GA >92wks)
Comments

 This patient was seen for a follow up exam as the views of
 the fetal anatomy were unable to be fully visualized during
 her last exam.  She denies any problems since her last exam.
 She was informed that the fetal growth and amniotic fluid
 level appears appropriate for her gestational age.
 The views of the fetal anatomy were visualized today.  There
 were no obvious anomalies noted.
 The limitations of ultrasound in the detection of all anomalies
 was discussed.
 Due to the marginal placental cord insertion noted today, a
 follow-up growth scan was scheduled in 7 weeks.

## 2021-03-17 IMAGING — US US MFM FETAL BPP W/O NON-STRESS
1 series · 12 of 28 positions shown · non-contrast
Comparison: none

[Series 1: us mfm fetal bpp w/o non-stress · 31 acquisitions, 12 frames shown]
[im 2/31]
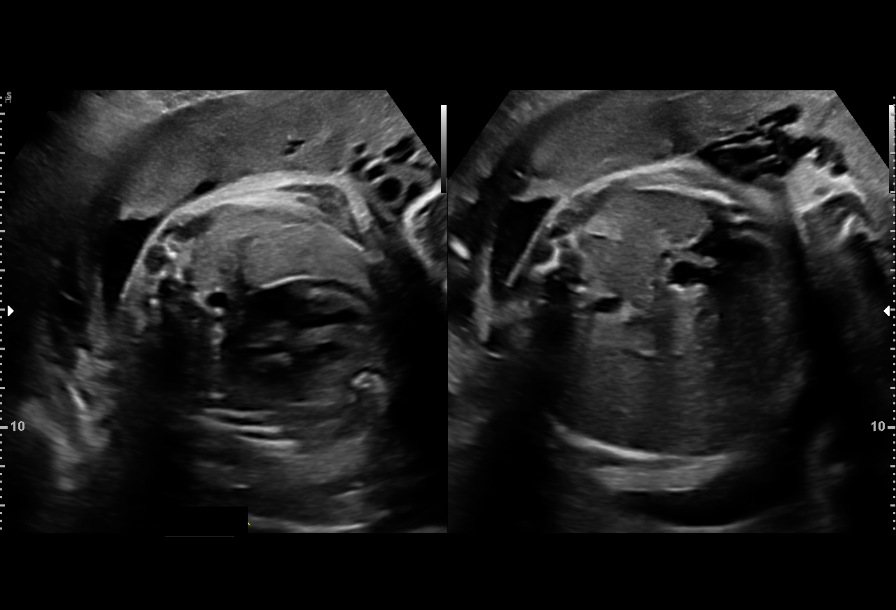
[im 4/31]
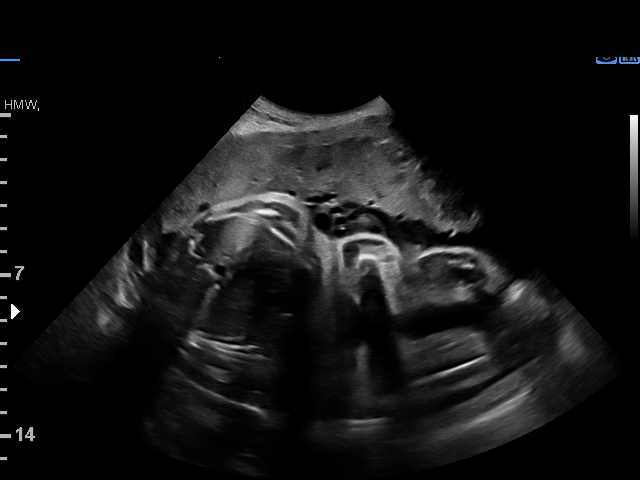
[im 6/31]
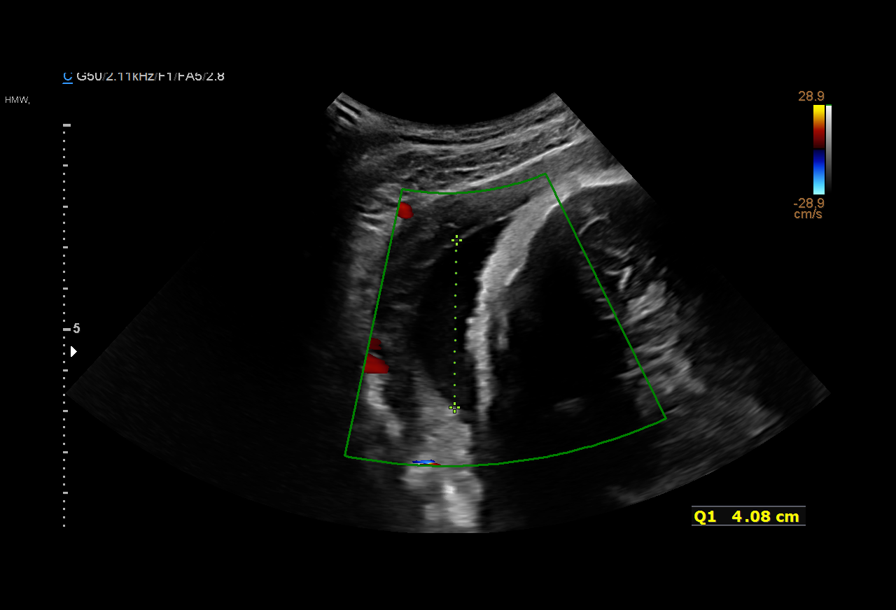
[im 9/31]
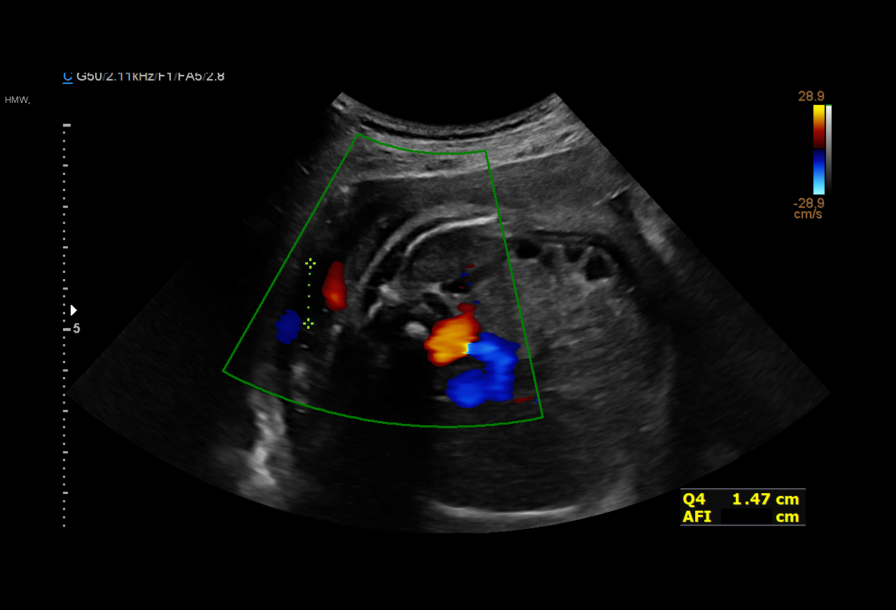
[im 12/31]
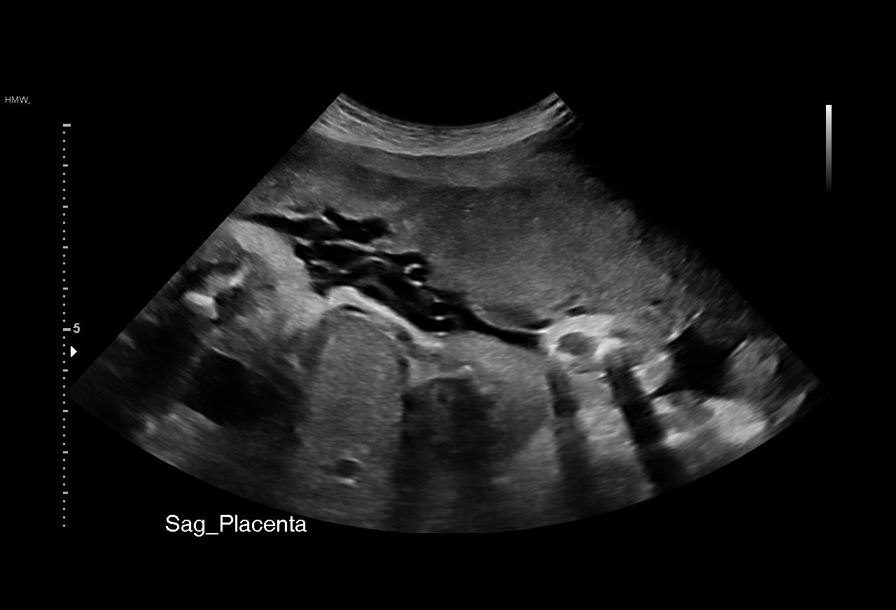
[im 14/31]
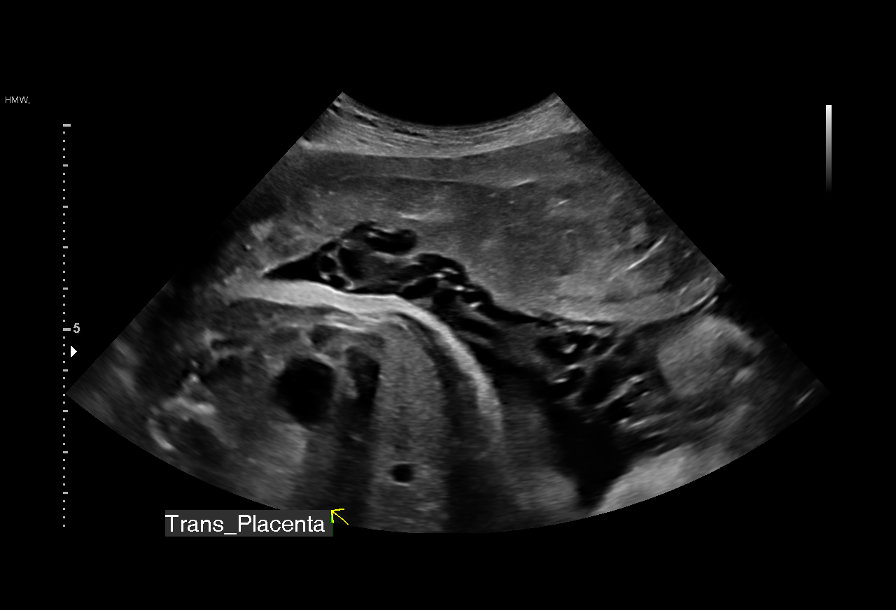
[im 17/31]
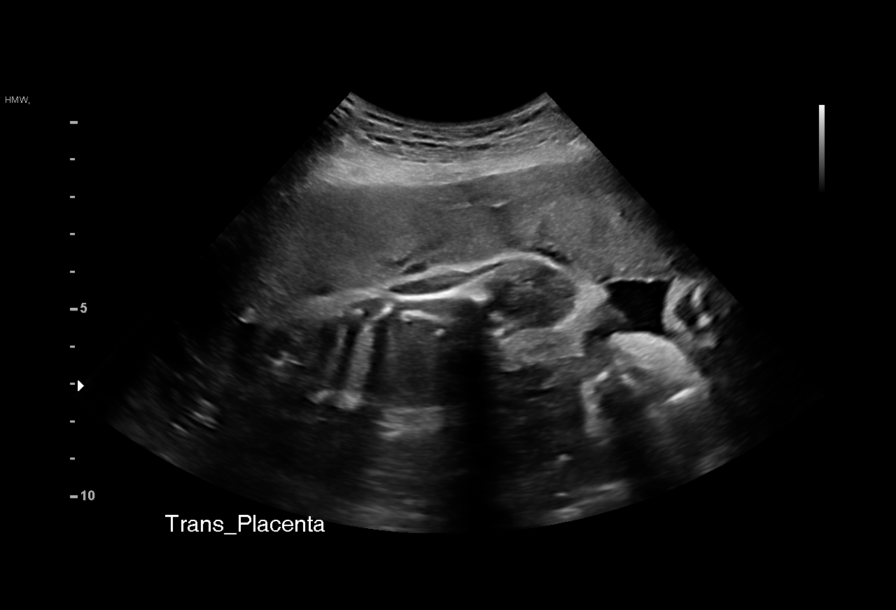
[im 19/31]
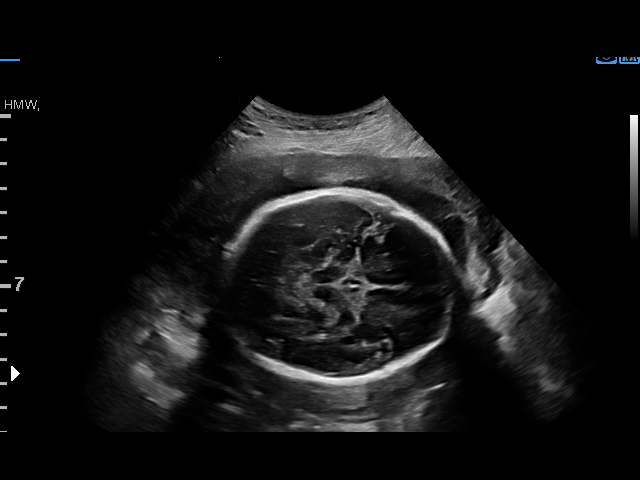
[im 22/31]
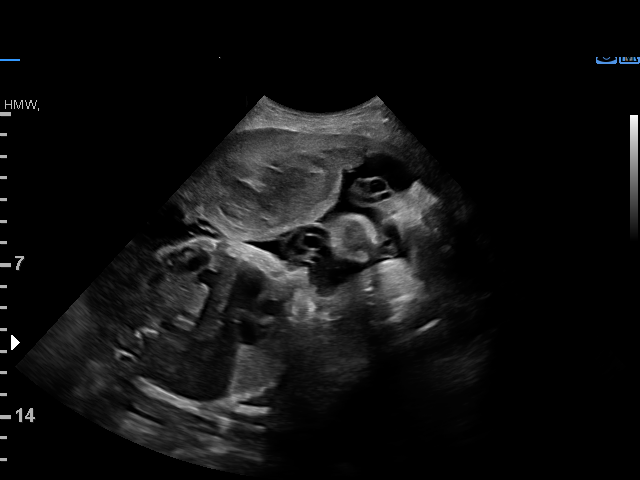
[im 25/31]
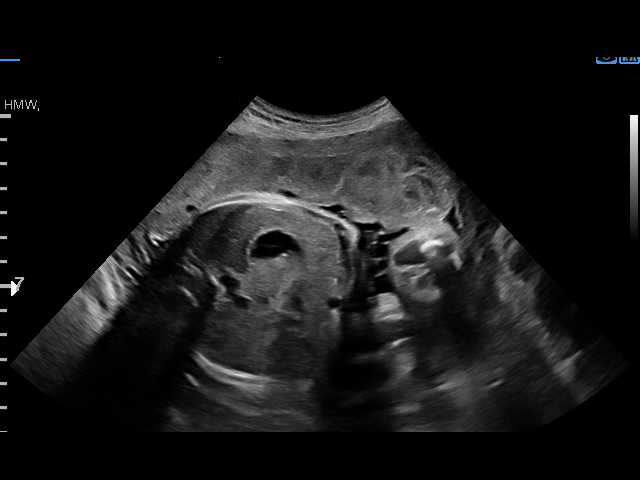
[im 27/31]
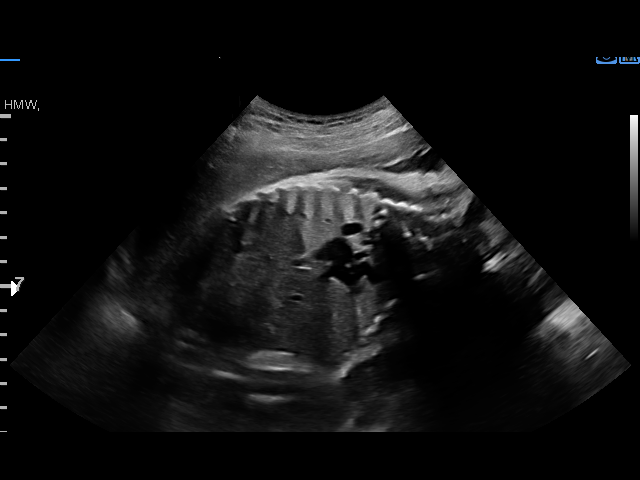
[im 29/31]
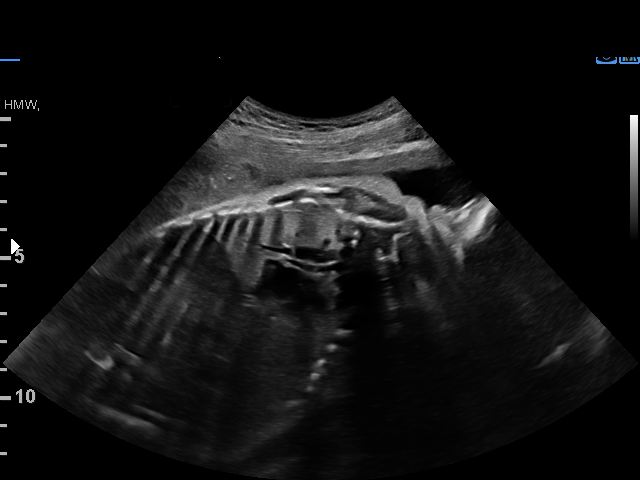

[12 of 28 positions shown; findings below may reference images not displayed]

RC NP

Indications

 Decreased fetal movement
 31 weeks gestation of pregnancy
 Marginal insertion of umbilical cord affecting
 management of mother in first trimester
 Encounter for antenatal screening,
 unspecified
Fetal Evaluation

 Num Of Fetuses:         1
 Fetal Heart Rate(bpm):  141
 Cardiac Activity:       Observed
 Presentation:           Cephalic
 Placenta:               Fundal

 Amniotic Fluid
 AFI FV:      Within normal limits

 AFI Sum(cm)     %Tile       Largest Pocket(cm)
 12.7            37

 RUQ(cm)       RLQ(cm)       LUQ(cm)        LLQ(cm)
 4.1           1.5           3.1            4
Biophysical Evaluation

 Amniotic F.V:   Within normal limits       F. Tone:        Observed
 F. Movement:    Observed                   Score:          [DATE]
 F. Breathing:   Observed
OB History

 Gravidity:    3         Term:   2        Prem:   0        SAB:   0
 TOP:          0       Ectopic:  0        Living: 2
Gestational Age

 LMP:           31w 4d        Date:  01/23/20                 EDD:   10/29/20
 Best:          31w 4d     Det. By:  LMP  (01/23/20)          EDD:   10/29/20
Anatomy

 Thoracic:              Appears normal         Bladder:                Appears normal
 Stomach:               Appears normal, left
                        sided
Impression

 Patient was evaluated for c/o decreased fetal movements .
 Amniotic fluid is normal and good fetal activity is seen
 .Antenatal testing is reassuring. BPP [DATE]. Cephalic
 presentation.
                 Yaa, Gena

## 2021-03-24 IMAGING — US US MFM OB FOLLOW-UP
1 series · 13 of 28 positions shown · non-contrast
Comparison: none

[Series 1: us mfm ob follow-up · 56 acquisitions, 13 frames shown]
[im 3/56]
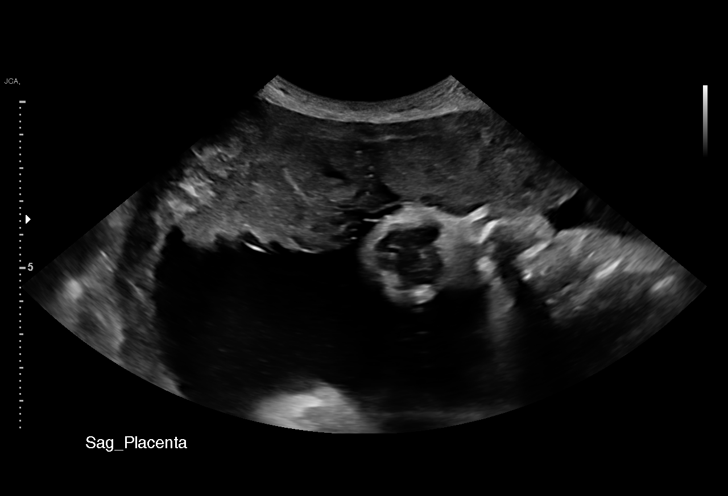
[im 7/56]
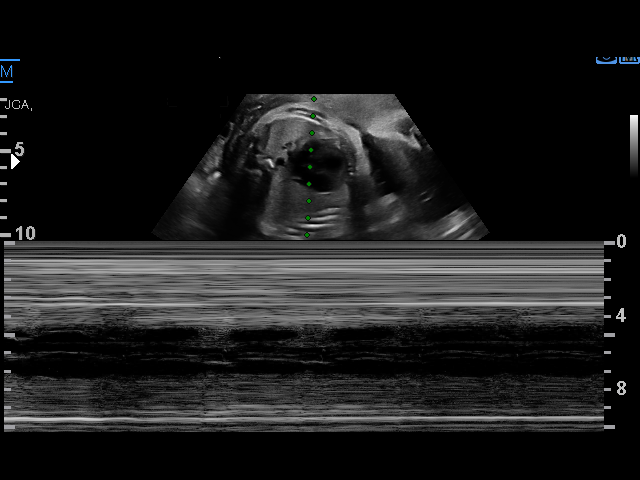
[im 11/56]
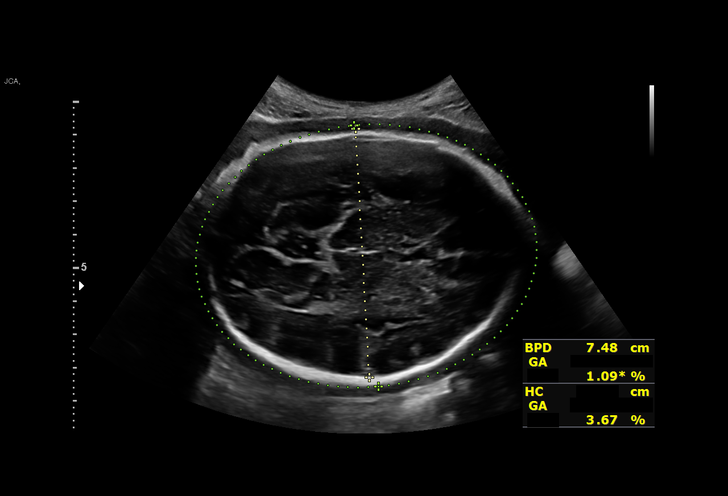
[im 15/56]
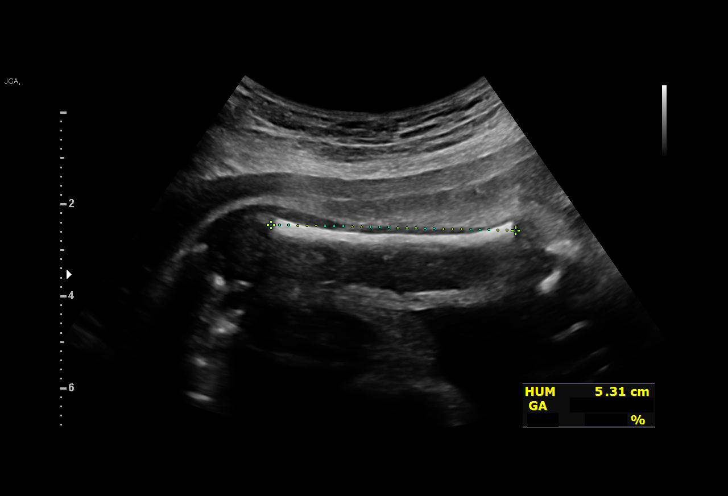
[im 19/56]
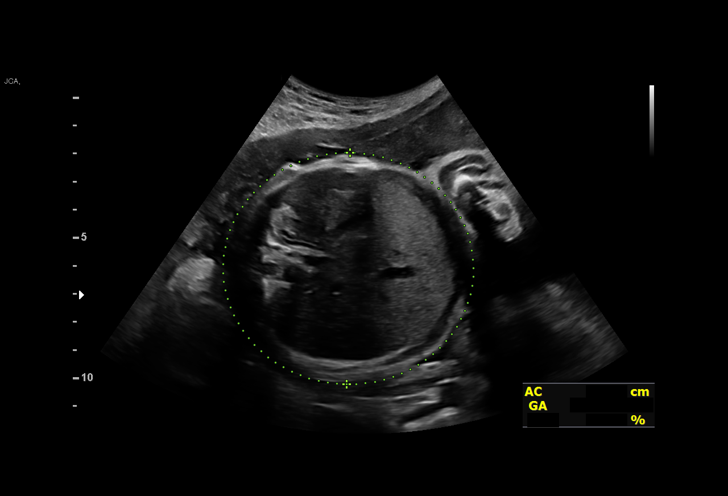
[im 23/56]
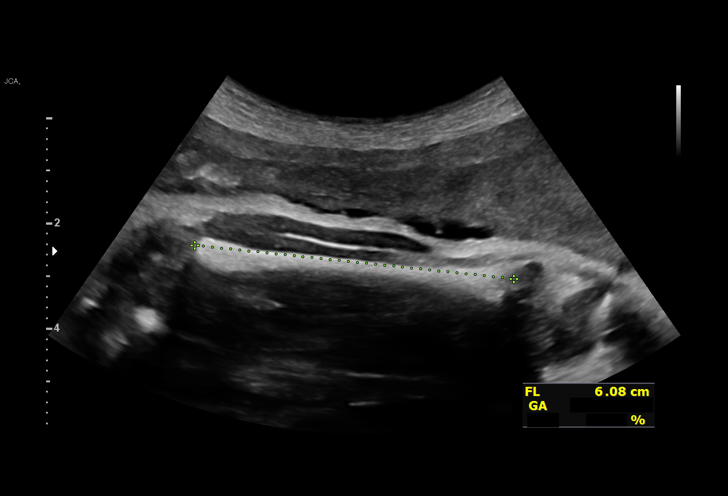
[im 29/56]
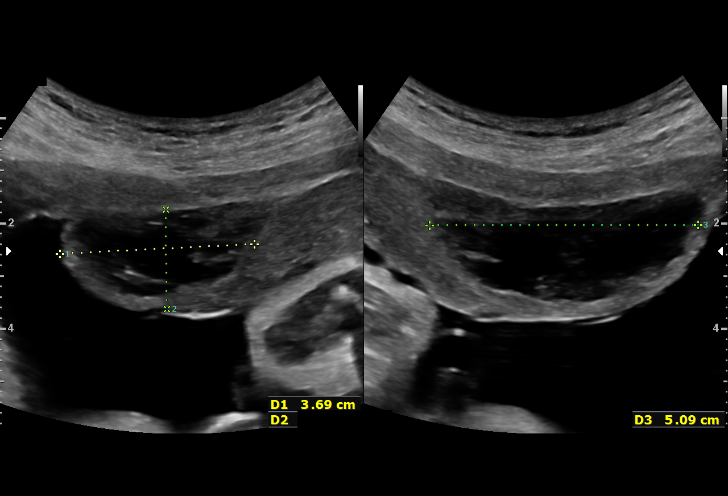
[im 33/56]
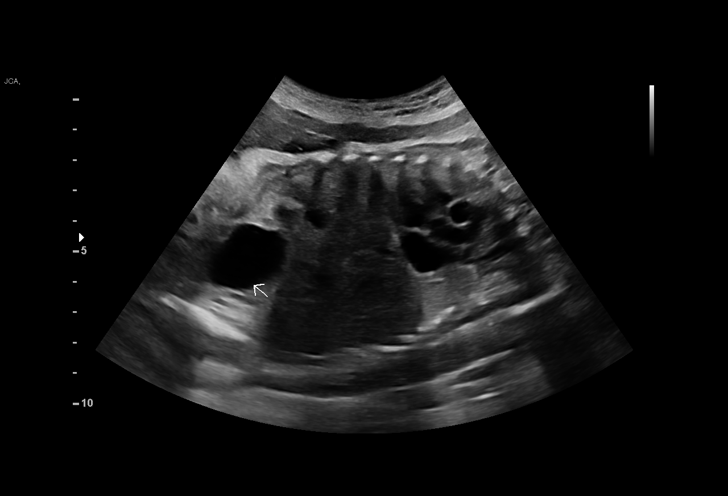
[im 37/56]
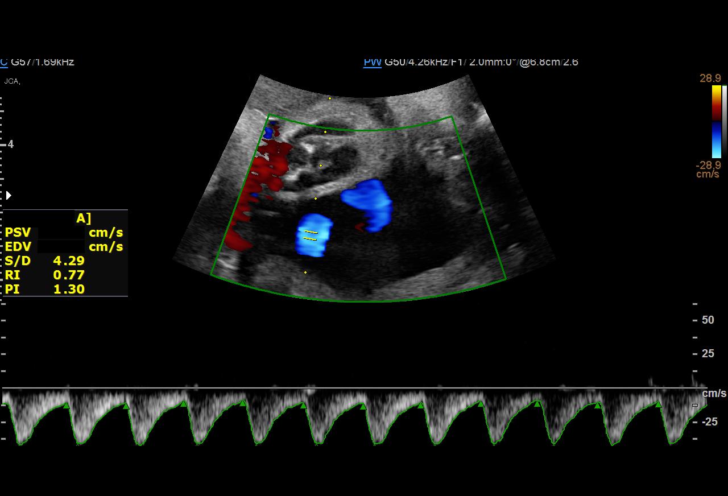
[im 41/56]
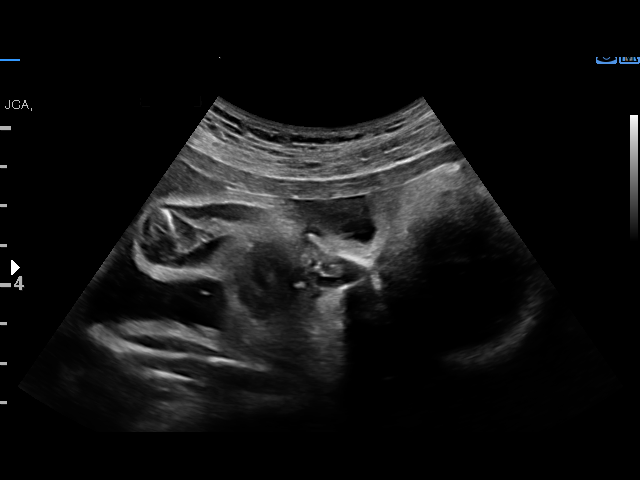
[im 45/56]
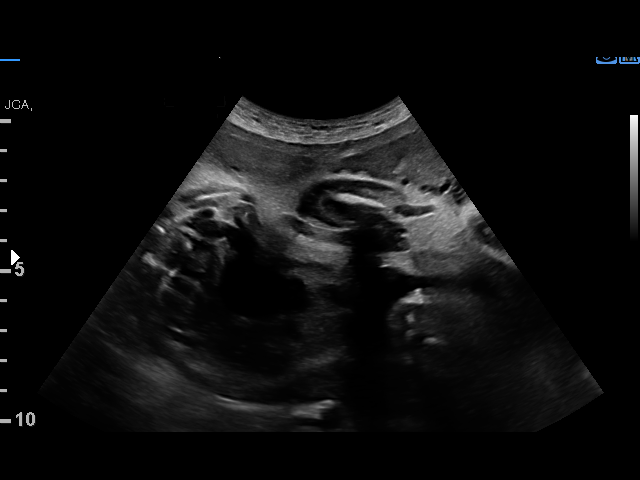
[im 49/56]
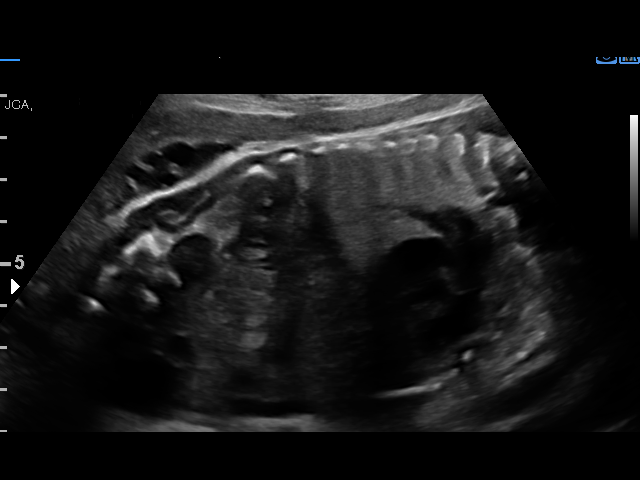
[im 53/56]
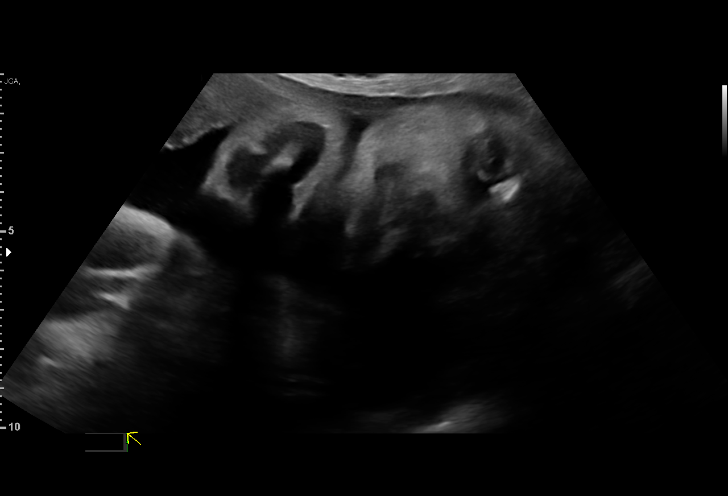

[13 of 28 positions shown; findings below may reference images not displayed]

ELMAHDI NP

    W/NONSTRESS

Indications

 Maternal care for known or suspected poor
 fetal growth, third trimester, fetus 1 IUGR
 Marginal insertion of umbilical cord affecting
 management of mother in first trimester
 Encounter for antenatal screening,
 unspecified
 32 weeks gestation of pregnancy
Fetal Evaluation

 Num Of Fetuses:         1
 Fetal Heart Rate(bpm):  145
 Cardiac Activity:       Observed
 Presentation:           Cephalic
 Placenta:               Fundal
 P. Cord Insertion:      Marginal insertion

 Amniotic Fluid
 AFI FV:      Within normal limits

 AFI Sum(cm)     %Tile       Largest Pocket(cm)
 10.65           22

 RUQ(cm)       RLQ(cm)       LUQ(cm)        LLQ(cm)
 3.36          0
Biophysical Evaluation

 Amniotic F.V:   Pocket => 2 cm             F. Tone:        Observed
 F. Movement:    Observed                   N.S.T:          Nonreactive
 F. Breathing:   Observed                   Score:          [DATE]
Biometry

 BPD:      73.8  mm     G. Age:  29w 4d        < 1  %    CI:         71.2   %    70 - 86
                                                         FL/HC:      21.2   %    19.9 -
 HC:      278.6  mm     G. Age:  30w 3d        < 1  %    HC/AC:      1.04        0.96 -
 AC:      267.9  mm     G. Age:  30w 6d          9  %    FL/BPD:     80.2   %    71 - 87
 FL:       59.2  mm     G. Age:  30w 6d          6  %    FL/AC:      22.1   %    20 - 24
 HUM:      52.3  mm     G. Age:  30w 3d         13  %

 Est. FW:    6795  gm      3 lb 9 oz    4.6  %
OB History

 Gravidity:    3         Term:   2        Prem:   0        SAB:   0
 TOP:          0       Ectopic:  0        Living: 2
Gestational Age

 LMP:           32w 4d        Date:  01/23/20                 EDD:   10/29/20
 U/S Today:     30w 3d                                        EDD:   11/13/20
 Best:          32w 4d     Det. By:  LMP  (01/23/20)          EDD:   10/29/20
Anatomy

 Cranium:               Previously seen        Aortic Arch:            Previously seen
 Cavum:                 Previously seen        Ductal Arch:            Previously seen
 Ventricles:            Appears normal         Diaphragm:              Appears normal
 Choroid Plexus:        Previously seen        Stomach:                Appears normal, left
                                                                       sided
 Cerebellum:            Previously seen        Abdomen:                Previously seen
 Posterior Fossa:       Previously seen        Abdominal Wall:         Previously seen
 Nuchal Fold:           Not applicable (>20    Cord Vessels:           Previously seen
                        wks GA)
 Face:                  Orbits and profile     Kidneys:                Appear normal
                        previously seen
 Lips:                  Previously seen        Bladder:                Appears normal
 Thoracic:              Appears normal         Spine:                  Previously seen
 Heart:                 Appears normal         Upper Extremities:      Previously seen
                        (4CH, axis, and
                        situs)
 RVOT:                  Previously seen        Lower Extremities:      Previously seen
 LVOT:                  Previously seen
Doppler - Fetal Vessels

 Umbilical Artery
  S/D     %tile      RI    %tile      PI    %tile            ADFV    RDFV
  5.28   > 97.5    0.81   > 97.5    1.43   > 97.5               No      No

Impression

 Marginal cord insertion.  Patient returned for fetal growth
 assessment.  Blood pressures today at our office were
 148/99 and repeat 133/85 mmHg.  Patient does not have
 symptoms of severe features of preeclampsia.

 On today's ultrasound, the estimated fetal weight is at the 5th
 percentile.  AC is at the 9th percentile and head
 circumference measurement is at between -2 and -1 SD
 (normal).  Amniotic fluid is normal and good fetal activity
 seen.  Cephalic presentation.  Clinical artery Doppler showed
 increased S/D ratio.  NST is not reactive. BPP [DATE].

 I explained fetal growth restriction.  Most likely cause is
 placental insufficiency.

 After NST, she had BP readings of 179/118 and 181/117 mm
 possibility of preeclampsia and recommended that she be
 evaluated at the [facility]. Patient agreed to go to the [facility].
 [facility] team was informed.
Recommendations

 Continue weekly BPP, NST and UA Doppler studies till
 delivery if discharged from the [facility].
                 Dode, Samiira

## 2021-06-29 ENCOUNTER — Other Ambulatory Visit: Payer: Self-pay

## 2021-06-29 ENCOUNTER — Emergency Department
Admission: EM | Admit: 2021-06-29 | Discharge: 2021-06-29 | Disposition: A | Discharge status: Home / Self Care | Payer: Medicaid Other | Attending: Emergency Medicine | Admitting: Emergency Medicine

## 2021-06-29 DIAGNOSIS — H60501 Unspecified acute noninfective otitis externa, right ear: Secondary | ICD-10-CM | POA: Insufficient documentation

## 2021-06-29 DIAGNOSIS — Z8616 Personal history of COVID-19: Secondary | ICD-10-CM | POA: Insufficient documentation

## 2021-06-29 DIAGNOSIS — H60311 Diffuse otitis externa, right ear: Secondary | ICD-10-CM

## 2021-06-29 DIAGNOSIS — H9201 Otalgia, right ear: Secondary | ICD-10-CM | POA: Diagnosis present

## 2021-06-29 MED ORDER — NEOMYCIN-POLYMYXIN-HC 3.5-10000-1 OT SOLN: 4.0000 [drp] | Freq: Three times a day (TID) | OTIC | 0 refills | Status: AC

## 2021-06-29 NOTE — Discharge Instructions (Addendum)
You have a swimmer's ear or acute otitis externa.  Has an infection in the ear canal.  Use the Cortisporin eardrops that I gave you with 3 or 4 drops in the ear and then make the wick as I showed you put the wick in the ear and add drops to that 3 more times a day.  Change the wick every day.  Return here if you are worse or no better in 3 or 4 days or see your doctor.  I asked the pharmacist for an ear washing system in try to use it to get the wax out of your left ear as well.  Be careful follow the instructions closely.  Do not irritate the skin of the ear canal.

## 2021-06-29 NOTE — ED Notes (Signed)
Irrigated R ear with 60 mL 50/50 warm water and peroxide.

## 2021-06-29 NOTE — ED Provider Notes (Signed)
[facility] Emergency Department Provider Note   ____________________________________________   Event Date/Time   First MD Initiated Contact with Patient 06/29/21 0800     (approximate)  I have reviewed the triage vital signs and the nursing notes.   HISTORY  Chief Complaint Otalgia   HPI Jill Arias is a 25 y.o. female who complains of right earache for about 3 weeks.  Is getting worse.  Hearing has decreased.  She has no nausea vomiting fever chills or other complaints.        Past Medical History:  Diagnosis Date   Medical history non-contributory     Patient Active Problem List   Diagnosis Date Noted   History of cesarean delivery, currently pregnant 10/18/2020   Nexplanon in place 09/12/2020   History of severe pre-eclampsia 09/07/2020   History of COVID-19 08/30/2020    Past Surgical History:  Procedure Laterality Date   CESAREAN SECTION  09/09/2020   Procedure: CESAREAN SECTION;  Surgeon: [place] Bing, MD;  Location: [facility] LD [facility]S;  Service: Obstetrics;;   TONSILLECTOMY      Prior to Admission medications   Medication Sig Start Date End Date Taking? Authorizing Provider  neomycin-polymyxin-hydrocortisone (CORTISPORIN) OTIC solution Place 4 drops into the right ear 3 (three) times daily for 10 days. 06/29/21 07/09/21 Yes Arnaldo Natal, MD  acetaminophen (TYLENOL) 500 MG tablet Take 500-1,000 mg by mouth every 6 (six) hours as needed for mild pain or headache.    [provider]  Blood Pressure Monitoring DEVI 1 each by Does not apply route once a week. 04/27/20   Currie Paris, NP  ibuprofen (ADVIL) 600 MG tablet Take 1 tablet (600 mg total) by mouth every 6 (six) hours as needed for fever or headache. 09/12/20   Warden Fillers, MD  Prenatal Vit-Fe Phos-FA-Omega (VITAFOL GUMMIES) 3.33-0.333-34.8 MG CHEW Chew 3 each by mouth daily. 04/27/20   Burleson, Brand Males, NP  vitamin C (ASCORBIC ACID) 250 MG tablet Take 500 mg  by mouth daily.    [provider]    Allergies Patient has no known allergies.  Family History  Problem Relation Age of Onset   Hypertension Mother     Social History Social History   Tobacco Use   Smoking status: Never   Smokeless tobacco: Never  Vaping Use   Vaping Use: Never used  Substance Use Topics   Alcohol use: Never   Drug use: Not Currently    Types: Marijuana    Review of Systems  Constitutional: No fever/chills Eyes: No visual changes. ENT: No sore throat. Cardiovascular: Denies chest pain. Respiratory: Denies shortness of breath. Gastrointestinal: No abdominal pain.  No nausea, no vomiting.  No diarrhea.  No constipation. Genitourinary: Negative for dysuria. Musculoskeletal: Negative for back pain. Skin: Negative for rash. Neurological: Negative for headaches, focal weakness   ____________________________________________   PHYSICAL EXAM:  VITAL SIGNS: ED Triage Vitals  Enc Vitals Group     BP 06/29/21 0754 135/89     Pulse Rate 06/29/21 0754 78     Resp 06/29/21 0754 18     Temp 06/29/21 0754 98.3 F (36.8 C)     Temp Source 06/29/21 0754 Oral     SpO2 06/29/21 0754 98 %     Weight 06/29/21 0753 130 lb (59 kg)     Height 06/29/21 0753 5\' 7"  (1.702 m)     Head Circumference --      Peak Flow --  Pain Score 06/29/21 0755 8     Pain Loc --      Pain Edu? --      Excl. in GC? --    Constitutional: Alert and oriented. Well appearing and in no acute distress. Eyes: Conjunctivae are normal.  Head: Atraumatic. Nose: No congestion/rhinnorhea. Ears: TMs left ear TM is obscured by wax.  Right ear canal slightly swollen TM is obscured by wax years tender to palp to traction. Mouth/Throat: Mucous membranes are moist.  Oropharynx non-erythematous. Neck: No stridor.   Cardiovascular:   Good peripheral circulation. Respiratory: Normal respiratory effort.   Neurologic:  Normal speech and language. No gross focal neurologic deficits are  appreciated.  Skin:  Skin is warm, dry and intact. No rash noted.   ____________________________________________   LABS (all labs ordered are listed, but only abnormal results are displayed)  Labs Reviewed - No data to display ____________________________________________  EKG   ____________________________________________  RADIOLOGY Jill Poling, personally viewed and evaluated these images (plain radiographs) as part of my medical decision making, as well as reviewing the written report by the radiologist.  ED MD interpretation:    Official radiology report(s): No results found.  ____________________________________________   PROCEDURES  Procedure(s) performed (including Critical Care):  Procedures   ____________________________________________   INITIAL IMPRESSION / ASSESSMENT AND PLAN / ED COURSE  Right ear irrigated by myself until 4 large clumps of wax come out.  Patient reports ear feels better.  Here hearing is back to normal.  Nail is somewhat red however.  Will use ear wicks and Cortisporin eardrops went over their use in the making of a white count of tissue paper with the patient in detail.  Want her to make sure the wick is beginning to get out and to change it every day.  Advised her to get over-the-counter ear wash system and use it on the left ear.             ____________________________________________   FINAL CLINICAL IMPRESSION(S) / ED DIAGNOSES  Final diagnoses:  Acute diffuse otitis externa of right ear   And earwax impaction  ED Discharge Orders          Ordered    neomycin-polymyxin-hydrocortisone (CORTISPORIN) OTIC solution  3 times daily        06/29/21 8185             Note:  This document was prepared using Dragon voice recognition software and may include unintentional dictation errors.    Arnaldo Natal, MD 06/29/21 [phone]

## 2021-06-29 NOTE — ED Notes (Addendum)
Pt c/o right ear pain for approx. 3 weeks. Pain has progressed and pt reports pain score of 8/10. Pt c/o pain radiating up right side of face and jaw. Pt c/o some hearing loss in ear. Pt had the flu early November and lasted approx. 2 weeks. No coughing, sneezing, runny nose or left ear pain reported.

## 2021-06-29 NOTE — ED Triage Notes (Signed)
Pt c/o right ear pain for the past 3 weeks,

## 2021-06-29 NOTE — ED Notes (Signed)
Assisted Dr Darnelle Catalan to irrigate R ear again. Dr Darnelle Catalan performed irrigation and removed a large amount of impacted ear wax from R ear. Pt verbalized relief.

## 2023-10-11 ENCOUNTER — Other Ambulatory Visit: Payer: Self-pay

## 2023-10-11 ENCOUNTER — Ambulatory Visit (INDEPENDENT_AMBULATORY_CARE_PROVIDER_SITE_OTHER): Payer: 59 | Admitting: Obstetrics and Gynecology

## 2023-10-11 ENCOUNTER — Encounter: Payer: Self-pay | Admitting: Obstetrics and Gynecology

## 2023-10-11 VITALS — BP 125/82 | HR 76 | Wt 152.3 lb

## 2023-10-11 DIAGNOSIS — Z3046 Encounter for surveillance of implantable subdermal contraceptive: Secondary | ICD-10-CM | POA: Diagnosis not present

## 2023-10-11 DIAGNOSIS — Z1331 Encounter for screening for depression: Secondary | ICD-10-CM | POA: Diagnosis not present

## 2023-10-11 MED ORDER — ETONOGESTREL 68 MG ~~LOC~~ IMPL
68.0000 mg | DRUG_IMPLANT | Freq: Once | SUBCUTANEOUS | Status: AC
  Administered 2023-10-11: 68 mg via SUBCUTANEOUS

## 2023-10-11 NOTE — Progress Notes (Unsigned)
    GYNECOLOGY VISIT  Patient name: Jill Arias MRN [id number]  Date of birth: 1995/09/24 Chief Complaint:   Contraception   History:  Dalyla Chui is a 28 y.o. [phone] being seen today for nexplanon exchange.  Nexplanon inserted 3 years ago, no issues and would like to resume.   Past Medical History:  Diagnosis Date   Medical history non-contributory     Past Surgical History:  Procedure Laterality Date   CESAREAN SECTION  09/09/2020   Procedure: CESAREAN SECTION;  Surgeon: [place] Bing, MD;  Location: [facility] LD ORS;  Service: Obstetrics;;   TONSILLECTOMY      The following portions of the patient's history were reviewed and updated as appropriate: allergies, current medications, past family history, past medical history, past social history, past surgical history and problem list.   Health Maintenance:   Last pap 08/06/2019 NILM Last mammogram: n/a   Review of Systems:  Pertinent items are noted in HPI. Comprehensive review of systems was otherwise negative.   Objective:  Physical Exam BP 125/82   Pulse 76   Wt 152 lb 4.8 oz (69.1 kg)   LMP 09/28/2023 (Approximate)   BMI 23.85 kg/m    Physical Exam Vitals and nursing note reviewed.  Constitutional:      Appearance: Normal appearance.  HENT:     Head: Normocephalic and atraumatic.  Pulmonary:     Effort: Pulmonary effort is normal.  Skin:    General: Skin is warm and dry.  Neurological:     General: No focal deficit present.     Mental Status: She is alert.  Psychiatric:        Mood and Affect: Mood normal.        Behavior: Behavior normal.        Thought Content: Thought content normal.        Judgment: Judgment normal.      Nexplanon Removal and Reinsertion Patient identified, informed consent performed, consent signed.   Patient does understand that irregular bleeding is a very common side effect of this medication. She was advised to have backup contraception for one week after replacement  of the implant. Pregnancy test in clinic today was negative.  Appropriate time out taken. Nexplanon site identified in left arm.  Area prepped in usual sterile fashon. Three ml of 1% lidocaine with epinephrine was used to anesthetize the area at the distal end of the implant. A small stab incision was made right beside the implant on the distal portion. The Nexplanon rod was grasped using hemostats and removed without difficulty. There was minimal blood loss. There were no complications. She was re-prepped with betadine, Nexplanon removed from packaging, Device confirmed in needle, then inserted full length of needle and withdrawn per handbook instructions. Nexplanon was able to palpated in the patient's arm; patient palpated the insert herself.  There was minimal blood loss. Patient insertion site covered with gauze and a pressure bandage to reduce any bruising. The patient tolerated the procedure well and was given post procedure instructions.       Assessment & Plan:   1. Encounter for removal and reinsertion of Nexplanon (Primary) Now s/p uncomplicated nexplanon removal and insertion - etonogestrel (NEXPLANON) implant 68 mg   Routine preventative health maintenance measures emphasized.  Lorriane Shire, MD [facility], [facility]

## 2023-11-27 ENCOUNTER — Ambulatory Visit: Admitting: Certified Nurse Midwife

## 2023-11-27 NOTE — Progress Notes (Unsigned)
 Patient did not attend appointment. May reschedule.   Raford Bunk, MSN, CNM, RNC-OB Certified Nurse Midwife, [facility] 11/28/2023 8:39 AM
